# Patient Record
Sex: Female | Born: 1995 | Race: White | Hispanic: No | Marital: Married | Smoking: Never smoker
Health system: Southern US, Community
[De-identification: ages and names within clinical notes are randomized; demographics above are authoritative.]

## PROBLEM LIST (undated history)

## (undated) DIAGNOSIS — F419 Anxiety disorder, unspecified: Secondary | ICD-10-CM

## (undated) DIAGNOSIS — N8300 Follicular cyst of ovary, unspecified side: Secondary | ICD-10-CM

## (undated) DIAGNOSIS — F988 Other specified behavioral and emotional disorders with onset usually occurring in childhood and adolescence: Secondary | ICD-10-CM

## (undated) DIAGNOSIS — Z8489 Family history of other specified conditions: Secondary | ICD-10-CM

## (undated) DIAGNOSIS — R102 Pelvic and perineal pain: Secondary | ICD-10-CM

## (undated) HISTORY — PX: WISDOM TOOTH EXTRACTION: SHX21

---
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
## 2014-03-05 ENCOUNTER — Encounter: Payer: Self-pay | Admitting: Family Medicine

## 2014-03-05 ENCOUNTER — Ambulatory Visit (INDEPENDENT_AMBULATORY_CARE_PROVIDER_SITE_OTHER): Admitting: Family Medicine

## 2014-03-05 VITALS — BP 106/58 | HR 64 | Temp 98.0°F | Resp 12 | Ht 63.0 in | Wt 141.0 lb

## 2014-03-05 DIAGNOSIS — N3000 Acute cystitis without hematuria: Secondary | ICD-10-CM

## 2014-03-05 DIAGNOSIS — R300 Dysuria: Secondary | ICD-10-CM

## 2014-03-05 LAB — URINALYSIS, ROUTINE W REFLEX MICROSCOPIC
Bilirubin Urine: NEGATIVE
GLUCOSE, UA: NEGATIVE mg/dL
Ketones, ur: NEGATIVE mg/dL
NITRITE: POSITIVE — AB
PH: 6 (ref 5.0–8.0)
Protein, ur: 100 mg/dL — AB
SPECIFIC GRAVITY, URINE: 1.015 (ref 1.005–1.030)
Urobilinogen, UA: 0.2 mg/dL (ref 0.0–1.0)

## 2014-03-05 LAB — URINALYSIS, MICROSCOPIC ONLY
CASTS: NONE SEEN
CRYSTALS: NONE SEEN

## 2014-03-05 MED ORDER — FLUCONAZOLE 150 MG PO TABS: 150.0000 mg | ORAL_TABLET | Freq: Every day | ORAL | Status: DC

## 2014-03-05 MED ORDER — NITROFURANTOIN MONOHYD MACRO 100 MG PO CAPS: 100.0000 mg | ORAL_CAPSULE | Freq: Two times a day (BID) | ORAL | Status: DC

## 2014-03-05 NOTE — Patient Instructions (Signed)
Call if not improved Take macrobid first, then take the diflucan Plenty of water and cranberry juice

## 2014-03-05 NOTE — Progress Notes (Signed)
Patient ID: Anne Underwood, female   DOB: Dec 13, 1995, 18 y.o.   MRN: [id number]   Subjective:    Patient ID: Anne Underwood, female    DOB: Dec 13, 1995, 18 y.o.   MRN: [id number]  Patient presents for possible UTI  patient here with dysuria for the past week and a half. She has some abdominal cramping associated. She denies any blood in her urine. She did take some over-the-counter AZO with minimal improvement. She is sexually active with one partner she is using her birth control her last initial period was 2 weeks ago. She did have some mild discharge today. No fever    Review Of Systems:  GEN- denies fatigue, fever, weight loss,weakness, recent illness HEENT- denies eye drainage, change in vision, nasal discharge, CVS- denies chest pain, palpitations RESP- denies SOB, cough, wheeze ABD- denies N/V, change in stools, abd pain GU- + dysuria, denies hematuria, dribbling, incontinence MSK- denies joint pain, muscle aches, injury Neuro- denies headache, dizziness, syncope, seizure activity       Objective:    BP 106/58  Pulse 64  Temp(Src) 98 F (36.7 C) (Oral)  Resp 12  Ht 5\' 3"  (1.6 m)  Wt 141 lb (63.957 kg)  BMI 24.98 kg/m2 GEN- NAD, alert and oriented x3 ABD-NABS,soft,suprapubic tenderness, no cva tenderness Pulses- Radial 2+       Assessment & Plan:      Problem List Items Addressed This Visit   None    Visit Diagnoses   Acute cystitis without hematuria    -  Primary    Uncomplicated UTD, Macrobid x 7 days, diflucan at end of course,     Burning with urination        Relevant Orders       Urinalysis, Routine w reflex microscopic (Completed)       Note: This dictation was prepared with Dragon dictation along with smaller phrase technology. Any transcriptional errors that result from this process are unintentional.

## 2014-06-07 ENCOUNTER — Ambulatory Visit (INDEPENDENT_AMBULATORY_CARE_PROVIDER_SITE_OTHER): Admitting: Family Medicine

## 2014-06-07 ENCOUNTER — Encounter: Payer: Self-pay | Admitting: Family Medicine

## 2014-06-07 VITALS — BP 100/62 | HR 88 | Temp 98.7°F | Resp 14 | Ht 63.0 in | Wt 139.0 lb

## 2014-06-07 DIAGNOSIS — J069 Acute upper respiratory infection, unspecified: Secondary | ICD-10-CM

## 2014-06-07 DIAGNOSIS — F900 Attention-deficit hyperactivity disorder, predominantly inattentive type: Secondary | ICD-10-CM

## 2014-06-07 DIAGNOSIS — R5081 Fever presenting with conditions classified elsewhere: Secondary | ICD-10-CM

## 2014-06-07 DIAGNOSIS — F988 Other specified behavioral and emotional disorders with onset usually occurring in childhood and adolescence: Secondary | ICD-10-CM

## 2014-06-07 DIAGNOSIS — H6501 Acute serous otitis media, right ear: Secondary | ICD-10-CM

## 2014-06-07 DIAGNOSIS — F410 Panic disorder [episodic paroxysmal anxiety] without agoraphobia: Secondary | ICD-10-CM

## 2014-06-07 MED ORDER — AMOXICILLIN 500 MG PO CAPS: 500.0000 mg | ORAL_CAPSULE | Freq: Three times a day (TID) | ORAL | Status: DC

## 2014-06-07 MED ORDER — METHYLPHENIDATE HCL ER (OSM) 18 MG PO TBCR: 18.0000 mg | EXTENDED_RELEASE_TABLET | Freq: Every day | ORAL | Status: DC

## 2014-06-07 NOTE — Progress Notes (Signed)
Patient ID: Lucas MallowHalie M Hibler, female   DOB: [birth year], 18 y.o.   MRN: [id number]   Subjective:    Patient ID: Lucas MallowHalie M Lewter, female    DOB: [birth year], 18 y.o.   MRN: [id number]  Patient presents for Illness and Difficulty Concentrating  Cough with productions, sinus pressure and drainage, right ear pain for a little over a week, + fever. [facility] diagnosed viral this was a few days into illness but not improving, taking  Nyquil.  Difficulty concentrating on school for years but now in college and grades are starting to suffer. Mind jumps around and she can not focus. She was afraid to bring it up at previous visits, thought she do her college work without any help. Panic attacks are under good control with prozac. Has never been on meds, she discussed with family would like to start something for her concentration. She does notice inattention outside of school as well. Family has noted before while in middle and high school but did not want to pursue meds at that time    Review Of Systems:  GEN- + fatigue,+ fever, weight loss,weakness, recent illness HEENT- denies eye drainage, change in vision,+ nasal discharge, CVS- denies chest pain, palpitations RESP- denies SOB, +cough, wheeze ABD- denies N/V, change in stools, abd pain GU- denies dysuria, hematuria, dribbling, incontinence MSK- denies joint pain, muscle aches, injury Neuro- denies headache, dizziness, syncope, seizure activity       Objective:    BP 100/62  Pulse 88  Temp(Src) 98.7 F (37.1 C) (Oral)  Resp 14  Ht 5\' 3"  (1.6 m)  Wt 139 lb (63.05 kg)  BMI 24.63 kg/m2 GEN- NAD, alert and oriented x3 HEENT- PERRL, EOMI, non injected sclera, pink conjunctiva, MMM, oropharynx clear, injected right TM with decreased light reflex, clear fluid, canal clear, left TM clear  Neck- Supple, shotty ant nodes CVS- RRR, no murmur RESP-CTAB Psych- normal affect and mood Pulses- Radial 2+  NEG Flu      Assessment & Plan:       Problem List Items Addressed This Visit   None    Visit Diagnoses   Fever presenting with conditions classified elsewhere    -  Primary    Relevant Orders       Influenza a and b    Right acute serous otitis media, recurrence not specified        Treat with amox x 10 days    Relevant Medications       amoxicillin (AMOXIL) capsule    Acute URI        Mucinex DM    ADD (attention deficit disorder) without hyperactivity        Trial of Concerta 18mg , discussed proper use of meds, concerns about dependence and abuse       Note: This dictation was prepared with Dragon dictation along with smaller phrase technology. Any transcriptional errors that result from this process are unintentional.

## 2014-06-07 NOTE — Patient Instructions (Addendum)
Use Mucinex DM  Start antibiotics  Start concerta once a day  F/U 4 weeks for new meds

## 2014-06-07 NOTE — Assessment & Plan Note (Signed)
Well controlled on SSRI

## 2014-06-08 ENCOUNTER — Telehealth: Payer: Self-pay | Admitting: Family Medicine

## 2014-06-08 MED ORDER — GUAIFENESIN-CODEINE 100-10 MG/5ML PO SYRP: 5.0000 mL | ORAL_SOLUTION | Freq: Three times a day (TID) | ORAL | Status: DC | PRN

## 2014-06-08 NOTE — Telephone Encounter (Signed)
Send in robitussin with codiene q 6 hours prn

## 2014-06-08 NOTE — Telephone Encounter (Signed)
Patient was in yesterday, mom is calling to say that she cannot sleep because of the coughing could we please call her in some cough syrup or something to help her? Please call mom at [phone]  *she is going back to college today at one**

## 2014-06-08 NOTE — Telephone Encounter (Signed)
Robitussin with codeine called into pharmacy, pt mother aware.

## 2014-06-21 ENCOUNTER — Telehealth: Payer: Self-pay | Admitting: Family Medicine

## 2014-06-21 NOTE — Telephone Encounter (Signed)
This is a stimulant medication it can cause the jitterness, have her discontinue the medication to let symptoms subside, she can come by the office and pick up non stimulant Blase MessStraterra which she can try in 1 week

## 2014-06-21 NOTE — Telephone Encounter (Signed)
Patient has not been seen in 9 months.  I would like to see her to discuss.  Meanwhile hold the meds to improve her symptoms.

## 2014-06-21 NOTE — Telephone Encounter (Signed)
Samples obtained from MD with directions to begin 25mg  PO QD x2 weeks, then increase to 40mg  PO QD and F/U in office in 4 weeks.   Call placed to patient. LMTRC.

## 2014-06-21 NOTE — Telephone Encounter (Signed)
Patients mom is calling to let us know that Zannah would like a call back regarding her add medication because it is making her really jittery [phone]

## 2014-06-21 NOTE — Telephone Encounter (Signed)
Dr. Jeanice Limurham saw pt will forward to her

## 2014-06-23 NOTE — Telephone Encounter (Signed)
Call placed to patient and patient made aware.  

## 2014-07-02 ENCOUNTER — Encounter: Payer: Self-pay | Admitting: Family Medicine

## 2014-07-02 ENCOUNTER — Telehealth: Payer: Self-pay | Admitting: Family Medicine

## 2014-07-02 ENCOUNTER — Ambulatory Visit: Admitting: Family Medicine

## 2014-07-02 ENCOUNTER — Ambulatory Visit (INDEPENDENT_AMBULATORY_CARE_PROVIDER_SITE_OTHER): Admitting: Family Medicine

## 2014-07-02 VITALS — BP 118/58 | HR 78 | Temp 98.6°F | Resp 14 | Ht 62.0 in | Wt 138.0 lb

## 2014-07-02 DIAGNOSIS — F988 Other specified behavioral and emotional disorders with onset usually occurring in childhood and adolescence: Secondary | ICD-10-CM | POA: Insufficient documentation

## 2014-07-02 DIAGNOSIS — F909 Attention-deficit hyperactivity disorder, unspecified type: Secondary | ICD-10-CM

## 2014-07-02 MED ORDER — ATOMOXETINE HCL 25 MG PO CAPS: 25.0000 mg | ORAL_CAPSULE | Freq: Every day | ORAL | Status: DC

## 2014-07-02 NOTE — Assessment & Plan Note (Signed)
Trial of stratterra, start with 25mg  once a day for 2 weeks, increase to 40mg  once a day return in 4 weeks, she is very sensitive to medications therefore our titrate her slowly

## 2014-07-02 NOTE — Telephone Encounter (Signed)
Patient returned call and states that she now thinks that she will be able to make it to the office in a reasonable amount of time.   Advised to come to office and MD will work in.

## 2014-07-02 NOTE — Progress Notes (Signed)
Patient ID: Anne Underwood, female   DOB: [birth year], 18 y.o.   MRN: [id number]   Subjective:    Patient ID: Anne MallowHalie M Underwood, female    DOB: [birth year], 18 y.o.   MRN: [id number]  Patient presents for Medication Review patient here to follow medication she was started on Concerta secondary to adult attention deficit disorder. She had significant side effects with jitteriness and insomnia and was unable to tolerate this medication at 18 mg after one week. She is here to try a different medication. She has no new concerns today. She still having difficulty concentrating and her midterms she did fair on. She is getting tutoring at college as well    Review Of Systems:  GEN- denies fatigue, fever, weight loss,weakness, recent illness HEENT- denies eye drainage, change in vision, nasal discharge, CVS- denies chest pain, palpitations RESP- denies SOB, cough, wheeze ABD- denies N/V, change in stools, abd pain GU- denies dysuria, hematuria, dribbling, incontinence MSK- denies joint pain, muscle aches, injury Neuro- denies headache, dizziness, syncope, seizure activity       Objective:    BP 118/58 mmHg  Pulse 78  Temp(Src) 98.6 F (37 C) (Oral)  Resp 14  Ht 5\' 2"  (1.575 m)  Wt 138 lb (62.596 kg)  BMI 25.23 kg/m2  LMP 06/14/2014 (Approximate) GEN- NAD, alert and oriented x3 CVS- RRR, no murmur RESP-CTAB Psych- normal affect and mood        Assessment & Plan:      Problem List Items Addressed This Visit    None      Note: This dictation was prepared with Dragon dictation along with smaller phrase technology. Any transcriptional errors that result from this process are unintentional.

## 2014-07-02 NOTE — Patient Instructions (Signed)
Start staterra 25mg  once a day with breakfast for 2 weeks Then increase 40mg  once a day  F/U 4 weeks
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
## 2014-07-02 NOTE — Telephone Encounter (Signed)
noted 

## 2014-07-02 NOTE — Telephone Encounter (Signed)
MD made aware.

## 2014-08-11 ENCOUNTER — Telehealth: Payer: Self-pay | Admitting: *Deleted

## 2014-08-11 NOTE — Telephone Encounter (Signed)
Received call from patient.   Reports that she is having many episodes of extreme fatigue. Reports that episodes occur approximately 1 hour after taking Strattera. Reports that fatigue lasts all day.   States that she has not taken medication on 08/10/2014 or 08/11/2014 and has not noted fatigue.   MD please advise.

## 2014-08-12 NOTE — Telephone Encounter (Signed)
I believe Dr. Jeanice Limurham started her on straterra.  Will refer to her to review.

## 2014-08-13 NOTE — Telephone Encounter (Signed)
Provider recommendations to pt and follow up appt made

## 2014-08-13 NOTE — Telephone Encounter (Signed)
She should be in 40mg  of stratterra given samples This medicine rarely gives fatigue, but she can try to stop for 1 week and make an appt so I can adjust medications

## 2014-08-23 ENCOUNTER — Encounter: Payer: Self-pay | Admitting: Family Medicine

## 2014-08-23 ENCOUNTER — Ambulatory Visit (INDEPENDENT_AMBULATORY_CARE_PROVIDER_SITE_OTHER): Admitting: Family Medicine

## 2014-08-23 VITALS — BP 112/64 | HR 70 | Temp 98.3°F | Resp 14 | Ht 62.0 in | Wt 138.0 lb

## 2014-08-23 DIAGNOSIS — J069 Acute upper respiratory infection, unspecified: Secondary | ICD-10-CM

## 2014-08-23 DIAGNOSIS — F909 Attention-deficit hyperactivity disorder, unspecified type: Secondary | ICD-10-CM

## 2014-08-23 DIAGNOSIS — F988 Other specified behavioral and emotional disorders with onset usually occurring in childhood and adolescence: Secondary | ICD-10-CM

## 2014-08-23 MED ORDER — LISDEXAMFETAMINE DIMESYLATE 30 MG PO CAPS: 30.0000 mg | ORAL_CAPSULE | Freq: Every day | ORAL | Status: DC

## 2014-08-23 NOTE — Patient Instructions (Signed)
Try the vyvanse   Call if not better or send email Use mucinex DM Okay to continue the allergy pill F/U 8 weeks

## 2014-08-23 NOTE — Assessment & Plan Note (Signed)
Trial of vyvanse 30mg  once a day F/u by phone, hopefully she will be able to tolerate this better

## 2014-08-23 NOTE — Progress Notes (Signed)
Patient ID: Anne Underwood, female   DOB: 10-18-1995, 18 y.o.   MRN: [id number]   Subjective:    Patient ID: Anne Underwood, female    DOB: 10-18-1995, 18 y.o.   MRN: [id number]  Patient presents for Medication Review patient here to follow-up medications. I'm treating her for ADD mostly difficulties with concentration and inattentiveness. She was tried on Concerta however she felt very jittery and overstimulated with the medication therefore we discontinued this. We then try Strattera at a low-dose do not have any side effects. Did not help her concentration we then increases to 40 mg she took one tablet states that she felt very sick and that she was on a pass out therefore she discontinued the medication also try something different. Of note she has had some allergy symptoms and a mild cough with minimal production the past couple days no fever no shortness of breath no wheezing.    Review Of Systems:  GEN- denies fatigue, fever, weight loss,weakness, recent illness HEENT- denies eye drainage, change in vision, nasal discharge, CVS- denies chest pain, palpitations RESP- denies SOB, +cough, wheeze ABD- denies N/V, change in stools, abd pain GU- denies dysuria, hematuria, dribbling, incontinence MSK- denies joint pain, muscle aches, injury Neuro- denies headache, dizziness, syncope, seizure activity       Objective:    BP 112/64 mmHg  Pulse 70  Temp(Src) 98.3 F (36.8 C) (Oral)  Resp 14  Ht 5\' 2"  (1.575 m)  Wt 138 lb (62.596 kg)  BMI 25.23 kg/m2 GEN- NAD, alert and oriented x3 HEENT- PERRL, EOMI, non injected sclera, pink conjunctiva, MMM, oropharynx clear, TM clear bilat no effusion, no maxillary sinus tenderness, nares clear, + post nasal drip Neck- Supple, no LAD CVS- RRR, no murmur RESP-CTAB Psych normal affect and mood EXT- No edema Pulses- Radial 2+          Assessment & Plan:      Problem List Items Addressed This Visit      Unprioritized   ADD (attention  deficit disorder) - Primary      Note: This dictation was prepared with Dragon dictation along with smaller phrase technology. Any transcriptional errors that result from this process are unintentional.

## 2014-09-06 ENCOUNTER — Encounter: Payer: Self-pay | Admitting: Family Medicine

## 2014-09-06 ENCOUNTER — Ambulatory Visit (INDEPENDENT_AMBULATORY_CARE_PROVIDER_SITE_OTHER): Admitting: Family Medicine

## 2014-09-06 VITALS — BP 116/62 | HR 64 | Temp 98.5°F | Resp 14 | Ht 62.0 in | Wt 141.0 lb

## 2014-09-06 DIAGNOSIS — F909 Attention-deficit hyperactivity disorder, unspecified type: Secondary | ICD-10-CM

## 2014-09-06 DIAGNOSIS — F988 Other specified behavioral and emotional disorders with onset usually occurring in childhood and adolescence: Secondary | ICD-10-CM

## 2014-09-06 MED ORDER — LISDEXAMFETAMINE DIMESYLATE 30 MG PO CAPS: 30.0000 mg | ORAL_CAPSULE | Freq: Every day | ORAL | Status: DC

## 2014-09-06 NOTE — Assessment & Plan Note (Signed)
Doing well on vyvnase, no side effects, will keep at same dose, states concentration is good at this dose Scripts for next 3 months given, if needed she will schedule appt or email if dose change is required Will f/u in office 6 months for CPE Okay for mother to pick up scripts since she is away at school

## 2014-09-06 NOTE — Patient Instructions (Signed)
Continue vyvanse at current dose 3 months of scripts given F/U 6 months

## 2014-09-06 NOTE — Progress Notes (Signed)
Patient ID: Anne Underwood, female   DOB: [birth year], 19 y.o.   MRN: [id number]   Subjective:    Patient ID: Anne Underwood, female    DOB: [birth year], 19 y.o.   MRN: [id number]  Patient presents for Medication Review  Pt here to f/u Vyvnase, changed about 3 weeks ago, due to SE with Concerta. Strattera did not work. Vyvanse works well, no SE, no insomnia, no jitteryness, no change in appetite. Would like to stay at current dose, feels she can focus without any problems.     Review Of Systems:  GEN- denies fatigue, fever, weight loss,weakness, recent illness HEENT- denies eye drainage, change in vision, nasal discharge, CVS- denies chest pain, palpitations RESP- denies SOB, cough, wheeze ABD- denies N/V, change in stools, abd pain GU- denies dysuria, hematuria, dribbling, incontinence MSK- denies joint pain, muscle aches, injury Neuro- denies headache, dizziness, syncope, seizure activity       Objective:    BP 116/62 mmHg  Pulse 64  Temp(Src) 98.5 F (36.9 C) (Oral)  Resp 14  Ht 5\' 2"  (1.575 m)  Wt 141 lb (63.957 kg)  BMI 25.78 kg/m2  LMP 09/04/2014 (Approximate) GEN- NAD, alert and oriented x3 Psych- normal affect and mood        Assessment & Plan:      Problem List Items Addressed This Visit      Unprioritized   ADD (attention deficit disorder) - Primary    Doing well on vyvnase, no side effects, will keep at same dose, states concentration is good at this dose Scripts for next 3 months given, if needed she will schedule appt or email if dose change is required Will f/u in office 6 months for CPE Okay for mother to pick up scripts since she is away at school       Note: This dictation was prepared with Dragon dictation along with smaller phrase technology. Any transcriptional errors that result from this process are unintentional.

## 2014-09-22 ENCOUNTER — Telehealth: Payer: Self-pay | Admitting: *Deleted

## 2014-09-22 NOTE — Telephone Encounter (Signed)
MD please advise

## 2014-09-22 NOTE — Telephone Encounter (Signed)
Pt called stating she is taking tri lo sprintec Birth control pills and had stopped taking it d/t making her sick, was originally on ortho tri cyclen but pharmacist but on sprintec because it was a cheaper version, wants to know if you can prescrbe her th ortho tri cyclen instead.  [id number] - [place], [place] - [address] AT [address]

## 2014-09-23 MED ORDER — ORTHO TRI-CYCLEN LO 0.18/0.215/0.25 MG-25 MCG PO TABS: 1.0000 | ORAL_TABLET | Freq: Every day | ORAL | Status: DC

## 2014-09-23 NOTE — Telephone Encounter (Signed)
Prescription sent to pharmacy. .   Call placed to patient and patient made aware.  

## 2014-09-23 NOTE — Telephone Encounter (Signed)
Ok with switch back to ortho tricyclen

## 2014-10-13 ENCOUNTER — Encounter (HOSPITAL_COMMUNITY)
Admission: RE | Admit: 2014-10-13 | Discharge: 2014-10-13 | Disposition: A | Discharge status: Home / Self Care | Source: Ambulatory Visit | Attending: Obstetrics and Gynecology | Admitting: Obstetrics and Gynecology

## 2014-10-13 ENCOUNTER — Encounter (HOSPITAL_COMMUNITY): Payer: Self-pay

## 2014-10-13 DIAGNOSIS — F988 Other specified behavioral and emotional disorders with onset usually occurring in childhood and adolescence: Secondary | ICD-10-CM | POA: Diagnosis not present

## 2014-10-13 DIAGNOSIS — N8320 Unspecified ovarian cysts: Secondary | ICD-10-CM | POA: Diagnosis present

## 2014-10-13 DIAGNOSIS — F419 Anxiety disorder, unspecified: Secondary | ICD-10-CM | POA: Diagnosis not present

## 2014-10-13 DIAGNOSIS — N831 Corpus luteum cyst: Secondary | ICD-10-CM | POA: Diagnosis not present

## 2014-10-13 DIAGNOSIS — R102 Pelvic and perineal pain: Secondary | ICD-10-CM | POA: Diagnosis not present

## 2014-10-13 HISTORY — DX: Anxiety disorder, unspecified: F41.9

## 2014-10-13 HISTORY — DX: Family history of other specified conditions: Z84.89

## 2014-10-13 HISTORY — DX: Other specified behavioral and emotional disorders with onset usually occurring in childhood and adolescence: F98.8

## 2014-10-13 LAB — CBC
HCT: 37.4 % (ref 36.0–46.0)
Hemoglobin: 12.3 g/dL (ref 12.0–15.0)
MCH: 27.9 pg (ref 26.0–34.0)
MCHC: 32.9 g/dL (ref 30.0–36.0)
MCV: 84.8 fL (ref 78.0–100.0)
PLATELETS: 239 10*3/uL (ref 150–400)
RBC: 4.41 MIL/uL (ref 3.87–5.11)
RDW: 14.1 % (ref 11.5–15.5)
WBC: 7.4 10*3/uL (ref 4.0–10.5)

## 2014-10-13 NOTE — Patient Instructions (Signed)
Your procedure is scheduled on:  Friday, October 15, 2014  Enter through the Main [facility]trance of [facility] at:  6:00 a.m.  Pick up the phone at the desk and dial [phone].  Call this number if you have problems the morning of surgery: [phone].  Remember: Do NOT eat food:  AFTER MIDNIGHT THURSDAY Do NOT drink clear liquids after: AFTER MIDNIGHT THURSDAY Take these medicines the morning of surgery with a SIP OF WATER:  PROZAC, VYVANSE  Do NOT wear jewelry (body piercing), metal hair clips/bobby pins, make-up, or nail polish. Do NOT wear lotions, powders, or perfumes.  You may wear deoderant. Do NOT shave for 48 hours prior to surgery. Do NOT bring valuables to the hospital. Contacts, dentures, or bridgework may not be worn into surgery.  Have a responsible adult drive you home and stay with you for 24 hours after your procedure  NO CELL PHONES ARE ALLOWED IN [facility]

## 2014-10-14 ENCOUNTER — Encounter (HOSPITAL_COMMUNITY): Payer: Self-pay | Admitting: Obstetrics and Gynecology

## 2014-10-14 DIAGNOSIS — N8300 Follicular cyst of ovary, unspecified side: Secondary | ICD-10-CM

## 2014-10-14 DIAGNOSIS — R102 Pelvic and perineal pain: Secondary | ICD-10-CM

## 2014-10-14 HISTORY — DX: Follicular cyst of ovary, unspecified side: N83.00

## 2014-10-14 HISTORY — DX: Pelvic and perineal pain: R10.2

## 2014-10-14 NOTE — H&P (Signed)
Anne Underwood is an 19 y.o. female with pelvic pain, persistent Right ovarian cyst. Approximately 5 cm.  L ovary with simple cyst on last US.  Pt with expectant management since 9/15, cyst persisted thru 1/16.  D/W pt and mother r/b/a of surgery, with poss cystectomy, RSO.  Voice understanding, wish to proceed.   Pertinent Gynecological History: Menses: regular every month without intermenstrual spotting Contraception: OCP (estrogen/progesterone) Blood transfusions: none Sexually transmitted diseases: no past history Previous GYN Procedures: N/A  Last mammogram: N/A  Last pap: N/A  OB History: G0P0   Menstrual History: No LMP recorded.    Past Medical History  Diagnosis Date  . Family history of adverse reaction to anesthesia     MOTHER HAD PONV  . ADD (attention deficit disorder)   . Anxiety   . Pelvic pain in female 10/14/2014  . Ovarian cyst, follicular 10/14/2014    Past Surgical History  Procedure Laterality Date  . Wisdom tooth extraction      Family History  Problem Relation Age of Onset  . Healthy Mother   . Cancer Mother [id number]    Breast Cancer  . Healthy Father   . Cancer Father     Social History:  reports that she has never smoked. She has never used smokeless tobacco. She reports that she does not drink alcohol or use illicit drugs.  Allergies: No Known Allergies   Meds Prozac, OTC Lo  All: NKDA   Review of Systems  Constitutional: Negative.   HENT: Negative.   Eyes: Negative.   Respiratory: Negative.   Cardiovascular: Negative.   Gastrointestinal: Positive for abdominal pain.  Genitourinary: Negative.   Musculoskeletal: Negative.   Skin: Negative.   Neurological: Negative.   Psychiatric/Behavioral: Negative.     There were no vitals taken for this visit. Physical Exam  Constitutional: She is oriented to person, place, and time. She appears well-developed and well-nourished.  HENT:  Head: Normocephalic and atraumatic.  Cardiovascular: Normal  rate and regular rhythm.   Respiratory: Effort normal and breath sounds normal. No respiratory distress. She has no wheezes.  GI: Soft. Bowel sounds are normal. She exhibits no distension. There is no tenderness.  Musculoskeletal: Normal range of motion.  Neurological: She is alert and oriented to person, place, and time.  Skin: Skin is warm and dry.  Psychiatric: She has a normal mood and affect. Her behavior is normal.    No results found for this or any previous visit (from the past 24 hour(s)).  No results found.   US R ovarian cyst, apx 5cm, am L simple cyst.  Nl uterus  Assessment/Plan: 19yo with persistent R ovarian cyst and pelvic pain Operative l/s - cystectomy - d/w pt r/b/a of procedure, wishes to proceed  Bovard-Stuckert, Ashana Tullo 10/14/2014, 9:47 PM

## 2014-10-15 ENCOUNTER — Ambulatory Visit (HOSPITAL_COMMUNITY): Admitting: Anesthesiology

## 2014-10-15 ENCOUNTER — Ambulatory Visit (HOSPITAL_COMMUNITY)
Admission: RE | Admit: 2014-10-15 | Discharge: 2014-10-15 | Disposition: A | Discharge status: Home / Self Care | Source: Ambulatory Visit | Attending: Obstetrics and Gynecology | Admitting: Obstetrics and Gynecology

## 2014-10-15 ENCOUNTER — Encounter (HOSPITAL_COMMUNITY)
Admission: RE | Disposition: A | Discharge status: Home / Self Care | Payer: Self-pay | Source: Ambulatory Visit | Attending: Obstetrics and Gynecology

## 2014-10-15 ENCOUNTER — Encounter (HOSPITAL_COMMUNITY): Payer: Self-pay

## 2014-10-15 DIAGNOSIS — N8300 Follicular cyst of ovary, unspecified side: Secondary | ICD-10-CM | POA: Diagnosis present

## 2014-10-15 DIAGNOSIS — N831 Corpus luteum cyst: Secondary | ICD-10-CM | POA: Insufficient documentation

## 2014-10-15 DIAGNOSIS — F988 Other specified behavioral and emotional disorders with onset usually occurring in childhood and adolescence: Secondary | ICD-10-CM | POA: Insufficient documentation

## 2014-10-15 DIAGNOSIS — F419 Anxiety disorder, unspecified: Secondary | ICD-10-CM | POA: Insufficient documentation

## 2014-10-15 DIAGNOSIS — R102 Pelvic and perineal pain: Secondary | ICD-10-CM | POA: Insufficient documentation

## 2014-10-15 HISTORY — DX: Follicular cyst of ovary, unspecified side: N83.00

## 2014-10-15 HISTORY — PX: OVARIAN CYST REMOVAL: SHX89

## 2014-10-15 HISTORY — PX: LAPAROSCOPY: SHX197

## 2014-10-15 HISTORY — DX: Pelvic and perineal pain: R10.2

## 2014-10-15 LAB — PREGNANCY, URINE: Preg Test, Ur: NEGATIVE

## 2014-10-15 SURGERY — EXCISION, CYST, OVARY
Anesthesia: General | Laterality: Right

## 2014-10-15 MED ORDER — KETOROLAC TROMETHAMINE 30 MG/ML IJ SOLN
INTRAMUSCULAR | Status: AC
  Filled 2014-10-15: qty 1

## 2014-10-15 MED ORDER — IBUPROFEN 800 MG PO TABS: 800.0000 mg | ORAL_TABLET | Freq: Three times a day (TID) | ORAL | Status: DC | PRN

## 2014-10-15 MED ORDER — ROCURONIUM BROMIDE 100 MG/10ML IV SOLN
INTRAVENOUS | Status: DC | PRN
  Administered 2014-10-15: 35 mg via INTRAVENOUS

## 2014-10-15 MED ORDER — MIDAZOLAM HCL 2 MG/2ML IJ SOLN
INTRAMUSCULAR | Status: DC | PRN
  Administered 2014-10-15: 2 mg via INTRAVENOUS

## 2014-10-15 MED ORDER — ONDANSETRON HCL 4 MG/2ML IJ SOLN
INTRAMUSCULAR | Status: DC | PRN
  Administered 2014-10-15: 4 mg via INTRAVENOUS

## 2014-10-15 MED ORDER — ONDANSETRON HCL 4 MG/2ML IJ SOLN
INTRAMUSCULAR | Status: AC
  Filled 2014-10-15: qty 2

## 2014-10-15 MED ORDER — FENTANYL CITRATE 0.05 MG/ML IJ SOLN
INTRAMUSCULAR | Status: DC | PRN
  Administered 2014-10-15 (×5): 50 ug via INTRAVENOUS

## 2014-10-15 MED ORDER — DEXAMETHASONE SODIUM PHOSPHATE 4 MG/ML IJ SOLN
INTRAMUSCULAR | Status: AC
  Filled 2014-10-15: qty 1

## 2014-10-15 MED ORDER — FENTANYL CITRATE 0.05 MG/ML IJ SOLN
25.0000 ug | INTRAMUSCULAR | Status: DC | PRN
  Administered 2014-10-15: 50 ug via INTRAVENOUS

## 2014-10-15 MED ORDER — LIDOCAINE HCL (CARDIAC) 20 MG/ML IV SOLN
INTRAVENOUS | Status: DC | PRN
  Administered 2014-10-15: 70 mg via INTRAVENOUS
  Administered 2014-10-15: 30 mg via INTRAVENOUS

## 2014-10-15 MED ORDER — MIDAZOLAM HCL 2 MG/2ML IJ SOLN
INTRAMUSCULAR | Status: AC
  Filled 2014-10-15: qty 2

## 2014-10-15 MED ORDER — SCOPOLAMINE 1 MG/3DAYS TD PT72
1.0000 | MEDICATED_PATCH | Freq: Once | TRANSDERMAL | Status: DC
  Administered 2014-10-15: 1.5 mg via TRANSDERMAL

## 2014-10-15 MED ORDER — MEPERIDINE HCL 25 MG/ML IJ SOLN: 6.2500 mg | INTRAMUSCULAR | Status: DC | PRN

## 2014-10-15 MED ORDER — SODIUM CHLORIDE 0.9 % IJ SOLN
INTRAMUSCULAR | Status: DC | PRN
  Administered 2014-10-15: 10 mL

## 2014-10-15 MED ORDER — NEOSTIGMINE METHYLSULFATE 10 MG/10ML IV SOLN
INTRAVENOUS | Status: DC | PRN
  Administered 2014-10-15: 4 mg via INTRAVENOUS

## 2014-10-15 MED ORDER — OXYCODONE-ACETAMINOPHEN 5-325 MG PO TABS: 1.0000 | ORAL_TABLET | Freq: Four times a day (QID) | ORAL | Status: DC | PRN

## 2014-10-15 MED ORDER — SODIUM CHLORIDE 0.9 % IJ SOLN
INTRAMUSCULAR | Status: AC
Start: 2014-10-15 — End: 2014-10-15
  Filled 2014-10-15: qty 10

## 2014-10-15 MED ORDER — GLYCOPYRROLATE 0.2 MG/ML IJ SOLN
INTRAMUSCULAR | Status: AC
  Filled 2014-10-15: qty 3

## 2014-10-15 MED ORDER — GLYCOPYRROLATE 0.2 MG/ML IJ SOLN
INTRAMUSCULAR | Status: DC | PRN
Start: 2014-10-15 — End: 2014-10-15
  Administered 2014-10-15: 0.6 mg via INTRAVENOUS

## 2014-10-15 MED ORDER — KETOROLAC TROMETHAMINE 30 MG/ML IJ SOLN
INTRAMUSCULAR | Status: DC | PRN
  Administered 2014-10-15: 30 mg via INTRAVENOUS

## 2014-10-15 MED ORDER — METOCLOPRAMIDE HCL 5 MG/ML IJ SOLN: 10.0000 mg | Freq: Once | INTRAMUSCULAR | Status: DC | PRN

## 2014-10-15 MED ORDER — LACTATED RINGERS IV SOLN: INTRAVENOUS | Status: DC

## 2014-10-15 MED ORDER — OXYCODONE-ACETAMINOPHEN 5-325 MG PO TABS
ORAL_TABLET | ORAL | Status: AC
  Administered 2014-10-15: 1 via ORAL
  Filled 2014-10-15: qty 1

## 2014-10-15 MED ORDER — BUPIVACAINE HCL (PF) 0.25 % IJ SOLN
INTRAMUSCULAR | Status: DC | PRN
  Administered 2014-10-15: 14 mL

## 2014-10-15 MED ORDER — SCOPOLAMINE 1 MG/3DAYS TD PT72
MEDICATED_PATCH | TRANSDERMAL | Status: AC
  Administered 2014-10-15: 1.5 mg via TRANSDERMAL
  Filled 2014-10-15: qty 1

## 2014-10-15 MED ORDER — ROCURONIUM BROMIDE 100 MG/10ML IV SOLN
INTRAVENOUS | Status: AC
  Filled 2014-10-15: qty 1

## 2014-10-15 MED ORDER — LACTATED RINGERS IV SOLN
INTRAVENOUS | Status: DC
  Administered 2014-10-15: 06:00:00 via INTRAVENOUS
  Administered 2014-10-15: 1000 mL via INTRAVENOUS
  Administered 2014-10-15: 07:00:00 via INTRAVENOUS

## 2014-10-15 MED ORDER — DEXAMETHASONE SODIUM PHOSPHATE 10 MG/ML IJ SOLN
INTRAMUSCULAR | Status: DC | PRN
  Administered 2014-10-15: 4 mg via INTRAVENOUS

## 2014-10-15 MED ORDER — PROPOFOL 10 MG/ML IV BOLUS
INTRAVENOUS | Status: DC | PRN
  Administered 2014-10-15: 180 mg via INTRAVENOUS

## 2014-10-15 MED ORDER — OXYCODONE HCL 5 MG/5ML PO SOLN: 5.0000 mg | Freq: Once | ORAL | Status: DC | PRN

## 2014-10-15 MED ORDER — NEOSTIGMINE METHYLSULFATE 10 MG/10ML IV SOLN
INTRAVENOUS | Status: AC
  Filled 2014-10-15: qty 1

## 2014-10-15 MED ORDER — PROPOFOL 10 MG/ML IV BOLUS
INTRAVENOUS | Status: AC
  Filled 2014-10-15: qty 20

## 2014-10-15 MED ORDER — FENTANYL CITRATE 0.05 MG/ML IJ SOLN
INTRAMUSCULAR | Status: AC
  Filled 2014-10-15: qty 5

## 2014-10-15 MED ORDER — LACTATED RINGERS IR SOLN
Status: DC | PRN
  Administered 2014-10-15: 3000 mL

## 2014-10-15 MED ORDER — FENTANYL CITRATE 0.05 MG/ML IJ SOLN
INTRAMUSCULAR | Status: AC
  Administered 2014-10-15: 50 ug via INTRAVENOUS
  Filled 2014-10-15: qty 2

## 2014-10-15 MED ORDER — BUPIVACAINE HCL (PF) 0.25 % IJ SOLN
INTRAMUSCULAR | Status: AC
  Filled 2014-10-15: qty 30

## 2014-10-15 MED ORDER — OXYCODONE HCL 5 MG PO TABS: 5.0000 mg | ORAL_TABLET | Freq: Once | ORAL | Status: DC | PRN

## 2014-10-15 SURGICAL SUPPLY — 24 items
CABLE HIGH FREQUENCY MONO STRZ (ELECTRODE) IMPLANT
CATH ROBINSON RED A/P 16FR (CATHETERS) ×3 IMPLANT
CHLORAPREP W/TINT 26ML (MISCELLANEOUS) ×3 IMPLANT
CLOTH BEACON ORANGE TIMEOUT ST (SAFETY) ×3 IMPLANT
DRSG COVADERM PLUS 2X2 (GAUZE/BANDAGES/DRESSINGS) ×6 IMPLANT
DRSG OPSITE POSTOP 3X4 (GAUZE/BANDAGES/DRESSINGS) ×3 IMPLANT
GLOVE BIO SURGEON STRL SZ 6.5 (GLOVE) ×3 IMPLANT
GOWN STRL REUS W/TWL LRG LVL3 (GOWN DISPOSABLE) ×6 IMPLANT
LIQUID BAND (GAUZE/BANDAGES/DRESSINGS) ×3 IMPLANT
NEEDLE INSUFFLATION 120MM (ENDOMECHANICALS) ×3 IMPLANT
NS IRRIG 1000ML POUR BTL (IV SOLUTION) ×3 IMPLANT
PACK LAPAROSCOPY BASIN (CUSTOM PROCEDURE TRAY) ×3 IMPLANT
PAD POSITIONER PINK NONSTERILE (MISCELLANEOUS) ×3 IMPLANT
POUCH SPECIMEN RETRIEVAL 10MM (ENDOMECHANICALS) ×3 IMPLANT
PROTECTOR NERVE ULNAR (MISCELLANEOUS) ×3 IMPLANT
SET IRRIG TUBING LAPAROSCOPIC (IRRIGATION / IRRIGATOR) ×3 IMPLANT
SHEARS HARMONIC ACE PLUS 36CM (ENDOMECHANICALS) ×3 IMPLANT
SUT VICRYL 0 UR6 27IN ABS (SUTURE) ×3 IMPLANT
SUT VICRYL 4-0 PS2 18IN ABS (SUTURE) ×3 IMPLANT
TOWEL OR 17X24 6PK STRL BLUE (TOWEL DISPOSABLE) ×6 IMPLANT
TROCAR XCEL NON-BLD 11X100MML (ENDOMECHANICALS) ×3 IMPLANT
TROCAR XCEL NON-BLD 5MMX100MML (ENDOMECHANICALS) ×6 IMPLANT
WARMER LAPAROSCOPE (MISCELLANEOUS) ×3 IMPLANT
WATER STERILE IRR 1000ML POUR (IV SOLUTION) ×3 IMPLANT

## 2014-10-15 NOTE — Brief Op Note (Signed)
10/15/2014  8:52 AM  PATIENT:  Lucas MallowHalie M Kirley  [age] y.o. female  PRE-OPERATIVE DIAGNOSIS:  Ovarian Cyst, Pelvic Pain  POST-OPERATIVE DIAGNOSIS:  Ovarian Cyst, Pelvic Pain  PROCEDURE:  Procedure(s) with comments: OVARIAN CYSTECTOMY (Right) - Dr only needs 1hr OR time LAPAROSCOPY OPERATIVE (N/A)  SURGEON:  Surgeon(s) and Role:    * Sherian ReinJody Bovard-Stuckert, MD - Primary  ANESTHESIA:   local and general  EBL:  Total I/O In: 100 [I.V.:100] Out: 150 [Urine:150]  BLOOD ADMINISTERED:none  DRAINS: none   LOCAL MEDICATIONS USED:  MARCAINE     SPECIMEN:  Source of Specimen:  ovarian cyst wall  DISPOSITION OF SPECIMEN:  PATHOLOGY  COUNTS:  YES  TOURNIQUET:  * No tourniquets in log *  DICTATION: .Other Dictation: Dictation Number [id number]  PLAN OF CARE: Discharge to home after PACU  PATIENT DISPOSITION:  PACU - hemodynamically stable.   Delay start of Pharmacological VTE agent (>24hrs) due to surgical blood loss or risk of bleeding: not applicable

## 2014-10-15 NOTE — Anesthesia Procedure Notes (Signed)
Procedure Name: Intubation Date/Time: 10/15/2014 7:28 AM Performed by: Suella GroveMOORE, Dejana Pugsley C Pre-anesthesia Checklist: Timeout performed, Patient identified, Emergency Drugs available, Suction available and Patient being monitored Patient Re-evaluated:Patient Re-evaluated prior to inductionOxygen Delivery Method: Circle system utilized and Simple face mask Preoxygenation: Pre-oxygenation with 100% oxygen Intubation Type: IV induction Ventilation: Mask ventilation without difficulty Laryngoscope Size: Mac and 3 Grade View: Grade II Tube type: Oral Tube size: 7.0 mm Number of attempts: 1 Placement Confirmation: ETT inserted through vocal cords under direct vision,  breath sounds checked- equal and bilateral and positive ETCO2 Secured at: 21 (com) cm Tube secured with: Tape Dental Injury: Teeth and Oropharynx as per pre-operative assessment

## 2014-10-15 NOTE — Anesthesia Postprocedure Evaluation (Signed)
  Anesthesia Post-op Note  Patient: Anne Underwood  Procedure(s) Performed: Procedure(s) with comments: OVARIAN CYSTECTOMY (Right) - Dr only needs 1hr OR time LAPAROSCOPY OPERATIVE (N/A)  Patient Location: PACU  Anesthesia Type:General  Level of Consciousness: awake, alert  and oriented  Airway and Oxygen Therapy: Patient Spontanous Breathing  Post-op Pain: mild  Post-op Assessment: Post-op Vital signs reviewed, Patient's Cardiovascular Status Stable, Respiratory Function Stable, Patent Airway, No signs of Nausea or vomiting and Pain level controlled  Post-op Vital Signs: Reviewed and stable  Last Vitals:  Filed Vitals:   10/15/14 0834  BP:   Pulse:   Temp: 36.7 C  Resp:     Complications: No apparent anesthesia complications

## 2014-10-15 NOTE — Discharge Instructions (Signed)
DISCHARGE INSTRUCTIONS: Laparoscopy  No Ibuprofen containing products (ie Advil, Aleve, Motrin, etc.) until after 2:00 pm today.  The following instructions have been prepared to help you care for yourself upon your return home today.  Wound care:  Do not get the incision wet for the first 24 hours. The incision should be kept clean and dry.  The dressing on the right side of your abdomen may be removed two days after surgery.  Should the incision become sore, red, and swollen after the first week, check with your doctor.  Personal hygiene:  Shower the day after your procedure.  Activity and limitations:  Do NOT drive or operate any equipment today.  Do NOT lift anything more than 15 pounds for 2-3 weeks after surgery.  Do NOT rest in bed all day.  Walking is encouraged. Walk each day, starting slowly with 5-minute walks 3 or 4 times a day. Slowly increase the length of your walks.  Walk up and down stairs slowly.  Do NOT do strenuous activities, such as golfing, playing tennis, bowling, running, biking, weight lifting, gardening, mowing, or vacuuming for 2-4 weeks. Ask your doctor when it is okay to start.  Diet: Eat a light meal as desired this evening. You may resume your usual diet tomorrow.  Return to work: This is dependent on the type of work you do. For the most part you can return to a desk job within a week of surgery. If you are more active at work, please discuss this with your doctor.  What to expect after your surgery: You may have a slight burning sensation when you urinate on the first day. You may have a very small amount of blood in the urine. Expect to have a small amount of vaginal discharge/light bleeding for 1-2 weeks. It is not unusual to have abdominal soreness and bruising for up to 2 weeks. You may be tired and need more rest for about 1 week. You may experience shoulder pain for 24-72 hours. Lying flat in bed may relieve it.  Call your doctor for any  of the following:  Develop a fever of 100.4 or greater  Inability to urinate 6 hours after discharge from hospital  Severe pain not relieved by pain medications  Persistent of heavy bleeding at incision site  Redness or swelling around incision site after a week  Increasing nausea or vomiting  Patient Signature________________________________________ Nurse Signature_________________________________________  PACU phone #:  [phone]

## 2014-10-15 NOTE — Transfer of Care (Signed)
Immediate Anesthesia Transfer of Care Note  Patient: Anne Underwood  Procedure(s) Performed: Procedure(s) with comments: OVARIAN CYSTECTOMY (Right) - Dr only needs 1hr OR time LAPAROSCOPY OPERATIVE (N/A)  Patient Location: PACU  Anesthesia Type:General  Level of Consciousness: awake, alert , oriented and patient cooperative  Airway & Oxygen Therapy: Patient Spontanous Breathing and Patient connected to nasal cannula oxygen  Post-op Assessment: Report given to RN and Post -op Vital signs reviewed and stable  Post vital signs: Reviewed and stable  Last Vitals:  Filed Vitals:   10/15/14 0606  BP: 114/65  Pulse: 72  Temp: 36.4 C  Resp: 16    Complications: No apparent anesthesia complications

## 2014-10-15 NOTE — Anesthesia Preprocedure Evaluation (Addendum)
Anesthesia Evaluation  Patient identified by MRN, date of birth, ID band Patient awake    Reviewed: Allergy & Precautions, NPO status , Patient's Chart, lab work & pertinent test results  History of Anesthesia Complications (+) history of anesthetic complications (mother has PONV)  Airway Mallampati: II  TM Distance: >3 FB Neck ROM: Full    Dental no notable dental hx. (+) Dental Advisory Given   Pulmonary neg pulmonary ROS,  breath sounds clear to auscultation  Pulmonary exam normal       Cardiovascular negative cardio ROS  Rhythm:Regular Rate:Normal     Neuro/Psych  Headaches, PSYCHIATRIC DISORDERS (ADD) Anxiety Depression ADHD   GI/Hepatic negative GI ROS, Neg liver ROS,   Endo/Other  negative endocrine ROS  Renal/GU negative Renal ROS  negative genitourinary   Musculoskeletal negative musculoskeletal ROS (+)   Abdominal   Peds negative pediatric ROS (+)  Hematology negative hematology ROS (+)   Anesthesia Other Findings   Reproductive/Obstetrics negative OB ROS Ovarian Cyst  Pelvic pain                            Anesthesia Physical Anesthesia Plan  ASA: II  Anesthesia Plan: General   Post-op Pain Management:    Induction: Intravenous  Airway Management Planned: Oral ETT  Additional Equipment:   Intra-op Plan:   Post-operative Plan: Extubation in OR  Informed Consent: I have reviewed the patients History and Physical, chart, labs and discussed the procedure including the risks, benefits and alternatives for the proposed anesthesia with the patient or authorized representative who has indicated his/her understanding and acceptance.   Dental advisory given  Plan Discussed with: CRNA, Anesthesiologist and Surgeon  Anesthesia Plan Comments:        Anesthesia Quick Evaluation

## 2014-10-15 NOTE — Interval H&P Note (Signed)
History and Physical Interval Note:  10/15/2014 7:14 AM  Anne Underwood  has presented today for surgery, with the diagnosis of Ovarian Cyst, Pelvic Pain  The various methods of treatment have been discussed with the patient and family. After consideration of risks, benefits and other options for treatment, the patient has consented to  Procedure(s) with comments: OVARIAN CYSTECTOMY (Right) - Dr only needs 1hr OR time LAPAROSCOPY OPERATIVE (N/A) as a surgical intervention .  The patient's history has been reviewed, patient examined, no change in status, stable for surgery.  I have reviewed the patient's chart and labs.  Questions were answered to the patient's satisfaction.     Bovard-Stuckert, Jordyne Poehlman

## 2014-10-16 NOTE — Op Note (Signed)
[name], Anne Underwood                 ACCOUNT NO.:  [id number]  MEDICAL RECORD NO.:  [id number]  LOCATION:  [facility]                          [facility]ACILITY:  [facility]  PHYSICIAN:  Sherron MondayJody Bovard, MD        DATE OF BIRTH:  1996/08/16  DATE OF PROCEDURE:  10/15/2014 DATE OF DISCHARGE:  10/15/2014                              OPERATIVE REPORT   PREOPERATIVE DIAGNOSES:  Persistent right ovarian cyst, pelvic pain.  POSTOPERATIVE DIAGNOSES:  Persistent right ovarian cyst, pelvic pain.  PROCEDURES:  Operative laparoscopy, right ovarian cystectomy.  SURGEON:  Sherron MondayJody Bovard, MD  ANESTHESIA:  Local and general anesthesia.  IV FLUIDS:  100 mL.  URINE OUTPUT:  150 mL.  ESTIMATED BLOOD LOSS:  Minimal.  COMPLICATIONS:  None.  PATHOLOGY:  Ovarian cyst wall.  DESCRIPTION OF PROCEDURE:  After informed consent was reviewed with the patient and her family, she was transported to the OR, placed on the table in supine position.  General anesthesia was induced and found to be adequate.  She was then placed in the Yellofin stirrups.  Prepped and draped in the normal sterile fashion.  Her bladder was sterilely draped. An open-sided speculum was used to easily visualize her cervix and the uterine manipulator was placed.  Gloves and gown were changed. Attention was turned to the abdominal portion of the case and approximately 5 mm infraumbilical incision was made.  Using the Veress needle, peritoneum was entered after verification with the hanging drop test.  The abdomen was insufflated and with direct visualization, the 5 mm port was placed at the umbilicus.  The camera was used to perform a brief pelvic survey, revealing normal uterus, normal left tube and ovary, normal right tube, right ovary with a cyst, approximately 5 cm in size, normal appendix, normal liver edge, normal gallbladder.  Attention was then turned to placing the accessory ports, 5 mm port was placed on the right under direct visualization after  Marcaine had been infused in the skin.  On the left, a 10 mm port was placed, so the EndoCatch bag could be used.  The cyst was drained with the aid of a Harmonic scalpel. Simple straw colored fluid was expressed from the cul-de-sac and evacuated using the suction irrigator.  The cyst was then teased from the ovary using Harmonic scalpel, removed through the EndoCatch bag and this was noted to be hemostatic.  The ports were removed under direct visualization.  Abdomen was emptied of the gas.  The left port and the port were closed with deep stitches of 3-0 Vicryl and Dermabond on the skin.  The larger port site with a deep stitch of 0 Vicryl as well as the skin was reapproximated with 3-0 Vicryl and Dermabond was also applied.  The patient tolerated the procedure well.  Sponge, lap, and needle counts were correct x2.     Sherron MondayJody Bovard, MD     JB/MEDQ  D:  10/15/2014  T:  10/16/2014  Job:  [id number]

## 2014-10-18 ENCOUNTER — Encounter (HOSPITAL_COMMUNITY): Payer: Self-pay | Admitting: Obstetrics and Gynecology

## 2014-10-18 ENCOUNTER — Telehealth: Payer: Self-pay | Admitting: Family Medicine

## 2014-10-18 NOTE — Telephone Encounter (Signed)
Which med?

## 2014-10-18 NOTE — Telephone Encounter (Signed)
[phone]  Patient is calling to say that the generic bc is making her sick

## 2014-10-19 ENCOUNTER — Telehealth: Payer: Self-pay | Admitting: *Deleted

## 2014-10-19 NOTE — Telephone Encounter (Signed)
Pt calling to see if can change ortho tri cyclen bc to something else, states that her insurance will not cover it anymore also wanted you to be aware she can not take ortho lo sprintec bc d/t is makes her sick.Please advise!  [id number] - [place], [place] - [address] AT [address]  Pt call back number [phone]

## 2014-10-21 ENCOUNTER — Telehealth: Payer: Self-pay | Admitting: *Deleted

## 2014-10-21 NOTE — Telephone Encounter (Signed)
Received call from patient mother Selena BattenKim.   Reports that patient requires BC pill that is on prescription formulary or cheaper than what she is taking.   MD please advise.

## 2014-10-21 NOTE — Telephone Encounter (Signed)
Can she get a list of what her insurance will cover so that we can avoid multiple guesses.  If not, try loestrin 24 poqday

## 2014-10-21 NOTE — Telephone Encounter (Signed)
Contacted pt and she is going to try to contact her insurance and see which ones they can cover and give me a call back. Pending call back from pt

## 2014-10-22 ENCOUNTER — Telehealth: Payer: Self-pay | Admitting: Family Medicine

## 2014-10-22 MED ORDER — NORGESTIM-ETH ESTRAD TRIPHASIC 0.18/0.215/0.25 MG-35 MCG PO TABS: 1.0000 | ORAL_TABLET | Freq: Every day | ORAL | Status: DC

## 2014-10-22 NOTE — Telephone Encounter (Signed)
Change to tri-sprintec 1 po daily, okay to send in 1 year worth of refills

## 2014-10-22 NOTE — Telephone Encounter (Signed)
Call placed to patient mother, Anne BattenKim. LMTRC.

## 2014-10-22 NOTE — Telephone Encounter (Signed)
Prescription sent to pharmacy.

## 2014-10-22 NOTE — Telephone Encounter (Signed)
[facility] [address] [place]  [phone] is pharmacy phone number

## 2014-10-22 NOTE — Telephone Encounter (Signed)
Patient mother Selena BattenKim returned call and was made aware. Advised that this medication is on the [facility] list for $9.00 per month.   Requested to contact office with the name of the pharmacy that she wants prescription sent to.

## 2014-10-22 NOTE — Telephone Encounter (Signed)
Patients mom kim calling with name of pharmacy and phone number

## 2014-10-26 NOTE — Telephone Encounter (Signed)
See phone message from 10/21/14 through 10/22/2014

## 2014-11-04 ENCOUNTER — Ambulatory Visit: Admitting: Physician Assistant

## 2014-11-30 ENCOUNTER — Telehealth: Payer: Self-pay | Admitting: Family Medicine

## 2014-11-30 NOTE — Telephone Encounter (Signed)
Returned call to patient.   Reports that she remains stable with her anxiety and medication. States that her school does have animal therapy available. Advised that if note required form MD that we could send note to school.

## 2014-11-30 NOTE — Telephone Encounter (Signed)
Patient calling to talk with you regarding her anxiety  [phone]

## 2014-12-09 ENCOUNTER — Telehealth: Payer: Self-pay | Admitting: Family Medicine

## 2014-12-09 MED ORDER — LISDEXAMFETAMINE DIMESYLATE 30 MG PO CAPS: 30.0000 mg | ORAL_CAPSULE | Freq: Every day | ORAL | Status: DC

## 2014-12-09 NOTE — Telephone Encounter (Signed)
[phone]  Pt mother has called pt is needing a refill on ADHD medication mother wasn't sure of name but I think it is the lisdexamfetamine (VYVANSE) 30 MG capsule

## 2014-12-09 NOTE — Telephone Encounter (Signed)
Ok for 3 months since patient is away at college?

## 2014-12-09 NOTE — Telephone Encounter (Signed)
Prescription printed and patient made aware to come to office to pick up.  

## 2014-12-09 NOTE — Telephone Encounter (Signed)
ok 

## 2014-12-09 NOTE — Telephone Encounter (Signed)
Ok to refill??  Last office visit/ refill 09/06/2014, #3 month prescriptions printed.

## 2015-02-08 ENCOUNTER — Ambulatory Visit (INDEPENDENT_AMBULATORY_CARE_PROVIDER_SITE_OTHER): Admitting: *Deleted

## 2015-02-08 DIAGNOSIS — Z111 Encounter for screening for respiratory tuberculosis: Secondary | ICD-10-CM

## 2015-02-09 ENCOUNTER — Encounter (HOSPITAL_COMMUNITY): Payer: Self-pay

## 2015-02-09 ENCOUNTER — Emergency Department (HOSPITAL_COMMUNITY)
Admission: EM | Admit: 2015-02-09 | Discharge: 2015-02-09 | Disposition: A | Discharge status: Home / Self Care | Attending: Emergency Medicine | Admitting: Emergency Medicine

## 2015-02-09 ENCOUNTER — Emergency Department (HOSPITAL_COMMUNITY)

## 2015-02-09 DIAGNOSIS — Z792 Long term (current) use of antibiotics: Secondary | ICD-10-CM | POA: Insufficient documentation

## 2015-02-09 DIAGNOSIS — Z79899 Other long term (current) drug therapy: Secondary | ICD-10-CM | POA: Diagnosis not present

## 2015-02-09 DIAGNOSIS — R509 Fever, unspecified: Secondary | ICD-10-CM | POA: Insufficient documentation

## 2015-02-09 DIAGNOSIS — Z3202 Encounter for pregnancy test, result negative: Secondary | ICD-10-CM | POA: Diagnosis not present

## 2015-02-09 DIAGNOSIS — F909 Attention-deficit hyperactivity disorder, unspecified type: Secondary | ICD-10-CM | POA: Insufficient documentation

## 2015-02-09 DIAGNOSIS — R52 Pain, unspecified: Secondary | ICD-10-CM | POA: Insufficient documentation

## 2015-02-09 DIAGNOSIS — Z8742 Personal history of other diseases of the female genital tract: Secondary | ICD-10-CM | POA: Diagnosis not present

## 2015-02-09 LAB — URINE MICROSCOPIC-ADD ON

## 2015-02-09 LAB — URINALYSIS, ROUTINE W REFLEX MICROSCOPIC
Bilirubin Urine: NEGATIVE
Glucose, UA: NEGATIVE mg/dL
Ketones, ur: NEGATIVE mg/dL
Leukocytes, UA: NEGATIVE
Nitrite: NEGATIVE
Protein, ur: NEGATIVE mg/dL
Specific Gravity, Urine: 1.02 (ref 1.005–1.030)
Urobilinogen, UA: 0.2 mg/dL (ref 0.0–1.0)
pH: 5.5 (ref 5.0–8.0)

## 2015-02-09 LAB — PREGNANCY, URINE: Preg Test, Ur: NEGATIVE

## 2015-02-09 MED ORDER — OXYCODONE-ACETAMINOPHEN 5-325 MG PO TABS
2.0000 | ORAL_TABLET | Freq: Once | ORAL | Status: AC
  Administered 2015-02-09: 2 via ORAL
  Filled 2015-02-09: qty 2

## 2015-02-09 MED ORDER — OXYCODONE-ACETAMINOPHEN 5-325 MG PO TABS: 1.0000 | ORAL_TABLET | ORAL | Status: DC | PRN

## 2015-02-09 MED ORDER — IBUPROFEN 400 MG PO TABS
600.0000 mg | ORAL_TABLET | Freq: Once | ORAL | Status: AC
  Administered 2015-02-09: 600 mg via ORAL
  Filled 2015-02-09: qty 2

## 2015-02-09 NOTE — ED Notes (Signed)
Pt c/o fever and generalized body aches that started today.  Reports fever 102 and pt took ibuprofen around 1300.

## 2015-02-09 NOTE — Discharge Instructions (Signed)

## 2015-02-10 ENCOUNTER — Ambulatory Visit: Admitting: *Deleted

## 2015-02-10 DIAGNOSIS — Z111 Encounter for screening for respiratory tuberculosis: Secondary | ICD-10-CM

## 2015-02-10 LAB — TB SKIN TEST
INDURATION: 0 mm
TB SKIN TEST: NEGATIVE

## 2015-02-11 ENCOUNTER — Ambulatory Visit (INDEPENDENT_AMBULATORY_CARE_PROVIDER_SITE_OTHER): Admitting: Family Medicine

## 2015-02-11 ENCOUNTER — Encounter: Payer: Self-pay | Admitting: Family Medicine

## 2015-02-11 VITALS — BP 112/68 | HR 84 | Temp 100.4°F | Resp 16 | Ht 62.0 in | Wt 139.0 lb

## 2015-02-11 DIAGNOSIS — B349 Viral infection, unspecified: Secondary | ICD-10-CM

## 2015-02-11 DIAGNOSIS — J039 Acute tonsillitis, unspecified: Secondary | ICD-10-CM | POA: Diagnosis not present

## 2015-02-11 LAB — INFLUENZA A AND B
Inflenza A Ag: NEGATIVE
Influenza B Ag: NEGATIVE

## 2015-02-11 LAB — RAPID STREP SCREEN (MED CTR MEBANE ONLY): STREPTOCOCCUS, GROUP A SCREEN (DIRECT): NEGATIVE

## 2015-02-11 MED ORDER — AMOXICILLIN-POT CLAVULANATE 875-125 MG PO TABS: 1.0000 | ORAL_TABLET | Freq: Two times a day (BID) | ORAL | Status: DC

## 2015-02-11 NOTE — Patient Instructions (Signed)
Take antibiotics Continue fever reducer Gargle salt water F/U as needed

## 2015-02-11 NOTE — Progress Notes (Signed)
Patient ID: Anne Underwood, female   DOB: Jul 22, 1996, 19 y.o.   MRN: [id number]   Subjective:    Patient ID: Anne Underwood, female    DOB: 12/07/1995, 19 y.o.   MRN: [id number]  Patient presents for Illness   Pt here with sore throat, headache, body aches, worsened over past 3 days, seen in ER 2 days ago, CXR neg, UA unremkarbale. Fever Tmax was 102 F, given percocet for pain, told likley flu related. Her throat is getting worse. History of recurrent strep throat      Review Of Systems:  GEN- + fatigue, +fever, weight loss,weakness, recent illness HEENT- denies eye drainage, change in vision, nasal discharge, CVS- denies chest pain, palpitations RESP- denies SOB, cough, wheeze ABD- + N/ denies V, change in stools, abd pain GU- denies dysuria, hematuria, dribbling, incontinence MSK- denies joint pain,+ muscle aches, injury Neuro- +headache, dizziness, syncope, seizure activity       Objective:    BP 112/68 mmHg  Pulse 84  Temp(Src) 100.4 F (38 C) (Oral)  Resp 16  Ht 5\' 2"  (1.575 m)  Wt 139 lb (63.05 kg)  BMI 25.42 kg/m2  LMP 02/09/2015 (Approximate) GEN- NAD, alert and oriented x3,febrile HEENT- PERRL, EOMI, non injected sclera, pink conjunctiva, MMM, oropharynx+ injection, + exudates TM clear bilat no effusion,  No  maxillary sinus tenderness, nares clear Neck- Supple, +LAD CVS- RRR, no murmur RESP-CTAB ABD-NABS,soft,NT,ND EXT- No edema Pulses- Radial 2+         Assessment & Plan:      Problem List Items Addressed This Visit    None    Visit Diagnoses    Tonsillitis with exudate    -  Primary    Augmentin x 10 days, ibupfrofen,started a viral illness, flu negative, rest, fluids    Relevant Orders    Rapid Strep Screen (Completed)    Viral illness        Relevant Orders    Influenza a and b (Completed)       Note: This dictation was prepared with Dragon dictation along with smaller phrase technology. Any transcriptional errors that result from this  process are unintentional.

## 2015-02-18 NOTE — ED Provider Notes (Signed)
CSN: [id number]     Arrival date & time 02/09/15  1724 History   First MD Initiated Contact with Patient 02/09/15 1747     Chief Complaint  Patient presents with  . Generalized Body Aches  . Fever     (Consider location/radiation/quality/duration/timing/severity/associated sxs/prior Treatment) HPI  19yF with fever and body aches. Onset earlier today. Just feels tied and run down. Fever to 102. Took some ibuprofen with some improvement of symptoms. No cough. No sob. No n/v/d. No sore throat. No urinary complaints. No sick contacts.   Past Medical History  Diagnosis Date  . ADD (attention deficit disorder)   . Anxiety   . Pelvic pain in female 10/14/2014  . Ovarian cyst, follicular 10/14/2014  . Family history of adverse reaction to anesthesia     MOTHER HAD PONV   Past Surgical History  Procedure Laterality Date  . Wisdom tooth extraction    . Ovarian cyst removal Right 10/15/2014    Procedure: OVARIAN CYSTECTOMY;  Surgeon: Sherian Rein, MD;  Location: [facility];  Service: Gynecology;  Laterality: Right;  Dr only needs 1hr OR time  . Laparoscopy N/A 10/15/2014    Procedure: LAPAROSCOPY OPERATIVE;  Surgeon: Sherian Rein, MD;  Location: [facility];  Service: Gynecology;  Laterality: N/A;   Family History  Problem Relation Age of Onset  . Healthy Mother   . Cancer Mother 27    Breast Cancer  . Healthy Father   . Cancer Father    History  Substance Use Topics  . Smoking status: Never Smoker   . Smokeless tobacco: Never Used  . Alcohol Use: No   OB History    No data available     Review of Systems  All systems reviewed and negative, other than as noted in HPI.   Allergies  Review of patient's allergies indicates no known allergies.  Home Medications   Prior to Admission medications   Medication Sig Start Date End Date Taking? Authorizing Provider  lisdexamfetamine (VYVANSE) 30 MG capsule Take 1 capsule (30 mg total) by mouth daily. 12/09/14  Yes Donita Brooks, MD  Norgestimate-Ethinyl Estradiol Triphasic (TRI-SPRINTEC) 0.18/0.215/0.25 MG-35 MCG tablet Take 1 tablet by mouth daily. 10/22/14  Yes Salley Scarlet, MD  amoxicillin-clavulanate (AUGMENTIN) 875-125 MG per tablet Take 1 tablet by mouth 2 (two) times daily. 02/11/15   Salley Scarlet, MD  ibuprofen (ADVIL,MOTRIN) 800 MG tablet Take 1 tablet (800 mg total) by mouth every 8 (eight) hours as needed for moderate pain. 10/15/14   Sherian Rein, MD  oxyCODONE-acetaminophen (PERCOCET/ROXICET) 5-325 MG per tablet Take 1 tablet by mouth every 4 (four) hours as needed for severe pain. 02/09/15   Raeford Razor, MD   BP 117/74 mmHg  Pulse 132  Temp(Src) 100.5 F (38.1 C) (Oral)  Resp 16  Ht 5\' 3"  (1.6 m)  Wt 140 lb (63.504 kg)  BMI 24.81 kg/m2  SpO2 100%  LMP 02/09/2015 Physical Exam  Constitutional: She is oriented to person, place, and time. She appears well-developed and well-nourished. No distress.  HENT:  Head: Normocephalic and atraumatic.  Eyes: Conjunctivae are normal. Right eye exhibits no discharge. Left eye exhibits no discharge.  Neck: Neck supple.  Cardiovascular: Normal rate, regular rhythm and normal heart sounds.  Exam reveals no gallop and no friction rub.   No murmur heard. Pulmonary/Chest: Effort normal and breath sounds normal. No respiratory distress.  Abdominal: Soft. She exhibits no distension. There is no tenderness.  Musculoskeletal: She exhibits no edema or  tenderness.  Neurological: She is alert and oriented to person, place, and time.  Skin: Skin is warm and dry.  Psychiatric: She has a normal mood and affect. Her behavior is normal. Thought content normal.  Nursing note and vitals reviewed.   ED Course  Procedures (including critical care time) Labs Review Labs Reviewed  URINALYSIS, ROUTINE W REFLEX MICROSCOPIC (NOT AT [facility]) - Abnormal; Notable for the following:    Hgb urine dipstick LARGE (*)    All other components within normal limits   URINE MICROSCOPIC-ADD ON - Abnormal; Notable for the following:    Squamous Epithelial / LPF MANY (*)    All other components within normal limits  PREGNANCY, URINE    Imaging Review No results found.   EKG Interpretation None      MDM   Final diagnoses:  Fever  Body aches    19 year old female with fever and body aches. Suspect viral illness. Low suspicion for SBI. Nontoxic. Hr significantly improved. HD stable. It has been determined that no acute conditions requiring further emergency intervention are present at this time. The patient has been advised of the diagnosis and plan. I reviewed any labs and imaging including any potential incidental findings. We have discussed signs and symptoms that warrant return to the ED and they are listed in the discharge instructions.      Raeford Razor, MD 02/19/15 [phone]

## 2015-06-03 ENCOUNTER — Ambulatory Visit (INDEPENDENT_AMBULATORY_CARE_PROVIDER_SITE_OTHER): Admitting: Family Medicine

## 2015-06-03 ENCOUNTER — Encounter: Payer: Self-pay | Admitting: Family Medicine

## 2015-06-03 VITALS — BP 102/60 | HR 80 | Temp 98.3°F | Resp 14 | Ht 62.0 in | Wt 146.0 lb

## 2015-06-03 DIAGNOSIS — K589 Irritable bowel syndrome without diarrhea: Secondary | ICD-10-CM | POA: Diagnosis not present

## 2015-06-03 DIAGNOSIS — R197 Diarrhea, unspecified: Secondary | ICD-10-CM | POA: Diagnosis not present

## 2015-06-03 DIAGNOSIS — R1084 Generalized abdominal pain: Secondary | ICD-10-CM | POA: Diagnosis not present

## 2015-06-03 LAB — URINALYSIS, ROUTINE W REFLEX MICROSCOPIC
BILIRUBIN URINE: NEGATIVE
GLUCOSE, UA: NEGATIVE
KETONES UR: NEGATIVE
Leukocytes, UA: NEGATIVE
Nitrite: NEGATIVE
PROTEIN: NEGATIVE
Specific Gravity, Urine: 1.025 (ref 1.001–1.035)
pH: 6 (ref 5.0–8.0)

## 2015-06-03 LAB — URINALYSIS, MICROSCOPIC ONLY
Casts: NONE SEEN [LPF]
Crystals: NONE SEEN [HPF]
Yeast: NONE SEEN [HPF]

## 2015-06-03 MED ORDER — DICYCLOMINE HCL 10 MG PO CAPS: 10.0000 mg | ORAL_CAPSULE | Freq: Three times a day (TID) | ORAL | Status: DC

## 2015-06-03 MED ORDER — ALIGN PO CAPS
1.0000 | ORAL_CAPSULE | Freq: Every day | ORAL | Status: DC
Start: 2015-06-03 — End: 2016-09-28

## 2015-06-03 NOTE — Patient Instructions (Addendum)
Try probiotics - Align  Stool Cultures  Labs to be down   Take Bentyl with meals as needed for spasm and pain F/U Pending results

## 2015-06-03 NOTE — Progress Notes (Signed)
Patient ID: Anne Underwood, female   DOB: 05/18/1996, 19 y.o.   MRN: [id number]   Subjective:    Patient ID: Anne Underwood, female    DOB: 18-Dec-1995, 19 y.o.   MRN: [id number]  Patient presents for Abdominal Pain  patient here with recurrent episodes of intermittent abdominal pain nausea and diarrhea. It typically happens every time she eats especially heavy meal spicy meal. She will have to run to the bathroom directly after and have a loose stool. She was evaluated back in January 2015 for this all of her labs were normal there is no evidence of gluten insensitivity or colitis. She had right upper quadrant ultrasound done which was negative. She denies any abdominal pain today states the past 2 days she's been fine but A, hips are out of nowhere. Denies any blood in the stool. Her mother is also having some gastrointestinal issues. Her menstrual cycle is regular    Review Of Systems:  GEN- denies fatigue, fever, weight loss,weakness, recent illness CVS- denies chest pain, palpitations RESP- denies SOB, cough, wheeze ABD- denies N/V, +change in stools,+ abd pain GU- denies dysuria, hematuria, dribbling, incontinence        Objective:    BP 102/60 mmHg  Pulse 80  Temp(Src) 98.3 F (36.8 C) (Oral)  Resp 14  Ht  (1.575 m)  Wt 146 lb (66.225 kg)  BMI 26.70 kg/m2  LMP 05/23/2015 GEN- NAD, alert and oriented x3, weight gain HEENT- PERRL, EOMI, non injected sclera, pink conjunctiva, MMM, oropharynx clear Neck- Supple, no thyromegaly CVS- RRR, no murmur RESP-CTAB ABD-NABS,soft,NT,ND EXT- No edema Pulses- Radial - 2+        Assessment & Plan:      Problem List Items Addressed This Visit    None    Visit Diagnoses    Generalized abdominal pain    -  Primary    Relevant Orders    Urinalysis, Routine w reflex microscopic (not at [facility]) (Completed)    IBS (irritable bowel syndrome)        Based on history, previous evaluation concern for more IBS picture, will give  [place] probiotic, she will return stool studies, consider GI pending workup    Relevant Medications    bifidobacterium infantis (ALIGN) capsule    dicyclomine (BENTYL) 10 MG capsule    Diarrhea, unspecified type        Relevant Orders    Stool culture    Fecal leukocytes    Ova and Parasite Examination    Fecal occult blood, imunochemical    CBC with Differential/Platelet    Comprehensive metabolic panel    Sedimentation Rate    C-reactive protein       Note: This dictation was prepared with Dragon dictation along with smaller phrase technology. Any transcriptional errors that result from this process are unintentional.

## 2015-06-04 LAB — CBC WITH DIFFERENTIAL/PLATELET
Basophils Absolute: 0 10*3/uL (ref 0.0–0.1)
Basophils Relative: 0 % (ref 0–1)
EOS ABS: 0.3 10*3/uL (ref 0.0–0.7)
EOS PCT: 3 % (ref 0–5)
HEMATOCRIT: 36.7 % (ref 36.0–46.0)
Hemoglobin: 12 g/dL (ref 12.0–15.0)
LYMPHS ABS: 2.7 10*3/uL (ref 0.7–4.0)
LYMPHS PCT: 29 % (ref 12–46)
MCH: 28 pg (ref 26.0–34.0)
MCHC: 32.7 g/dL (ref 30.0–36.0)
MCV: 85.7 fL (ref 78.0–100.0)
MPV: 11 fL (ref 8.6–12.4)
Monocytes Absolute: 0.6 10*3/uL (ref 0.1–1.0)
Monocytes Relative: 6 % (ref 3–12)
Neutro Abs: 5.8 10*3/uL (ref 1.7–7.7)
Neutrophils Relative %: 62 % (ref 43–77)
PLATELETS: 225 10*3/uL (ref 150–400)
RBC: 4.28 MIL/uL (ref 3.87–5.11)
RDW: 14.7 % (ref 11.5–15.5)
WBC: 9.3 10*3/uL (ref 4.0–10.5)

## 2015-06-04 LAB — COMPREHENSIVE METABOLIC PANEL
ALK PHOS: 39 U/L (ref 33–115)
ALT: 17 U/L (ref 6–29)
AST: 17 U/L (ref 10–30)
Albumin: 3.7 g/dL (ref 3.6–5.1)
BUN: 10 mg/dL (ref 7–25)
CO2: 27 mmol/L (ref 20–31)
Calcium: 8.9 mg/dL (ref 8.6–10.2)
Chloride: 107 mmol/L (ref 98–110)
Creat: 0.76 mg/dL (ref 0.50–1.10)
GLUCOSE: 65 mg/dL — AB (ref 70–99)
POTASSIUM: 4.3 mmol/L (ref 3.5–5.3)
Sodium: 142 mmol/L (ref 135–146)
Total Bilirubin: 0.4 mg/dL (ref 0.2–1.2)
Total Protein: 6.6 g/dL (ref 6.1–8.1)

## 2015-06-04 LAB — C-REACTIVE PROTEIN: CRP: <0.5 mg/dL (ref <0.60)

## 2015-06-04 LAB — SEDIMENTATION RATE: Sed Rate: 1 mm/hr (ref 0–20)

## 2015-06-06 ENCOUNTER — Telehealth: Payer: Self-pay | Admitting: Family Medicine

## 2015-06-06 NOTE — Telephone Encounter (Signed)
Pt left a voicemail only requesting to speak with Dr. Jeanice Lim or a nurse. Her phone number is [phone]

## 2015-06-07 ENCOUNTER — Other Ambulatory Visit

## 2015-06-07 DIAGNOSIS — R197 Diarrhea, unspecified: Secondary | ICD-10-CM

## 2015-06-07 NOTE — Telephone Encounter (Signed)
Returned call to patient.   Patient had question about proper collection of stool sample.   Advised how to collect and return sample.

## 2015-06-08 LAB — FECAL LACTOFERRIN, QUANT: Lactoferrin: NEGATIVE

## 2015-06-08 LAB — FECAL OCCULT BLOOD, IMMUNOCHEMICAL: FECAL OCCULT BLOOD: NEGATIVE

## 2015-06-08 LAB — OVA AND PARASITE EXAMINATION: OP: NONE SEEN

## 2015-06-11 LAB — STOOL CULTURE

## 2015-10-03 ENCOUNTER — Other Ambulatory Visit: Payer: Self-pay | Admitting: Family Medicine

## 2015-10-03 NOTE — Telephone Encounter (Signed)
Refill appropriate and filled per protocol. 

## 2015-10-31 ENCOUNTER — Other Ambulatory Visit: Payer: Self-pay | Admitting: Family Medicine

## 2015-10-31 NOTE — Telephone Encounter (Signed)
Refill appropriate and filled per protocol. 

## 2016-09-28 ENCOUNTER — Ambulatory Visit (INDEPENDENT_AMBULATORY_CARE_PROVIDER_SITE_OTHER): Admitting: Family Medicine

## 2016-09-28 ENCOUNTER — Telehealth: Payer: Self-pay | Admitting: Family Medicine

## 2016-09-28 ENCOUNTER — Encounter: Payer: Self-pay | Admitting: Family Medicine

## 2016-09-28 VITALS — BP 124/70 | HR 80 | Temp 98.6°F | Resp 14 | Ht 62.0 in | Wt 142.0 lb

## 2016-09-28 DIAGNOSIS — Z8742 Personal history of other diseases of the female genital tract: Secondary | ICD-10-CM

## 2016-09-28 DIAGNOSIS — R102 Pelvic and perineal pain: Secondary | ICD-10-CM

## 2016-09-28 DIAGNOSIS — N83209 Unspecified ovarian cyst, unspecified side: Secondary | ICD-10-CM

## 2016-09-28 DIAGNOSIS — Z3009 Encounter for other general counseling and advice on contraception: Secondary | ICD-10-CM | POA: Diagnosis not present

## 2016-09-28 LAB — PREGNANCY, URINE: PREG TEST UR: NEGATIVE

## 2016-09-28 MED ORDER — NORETHINDRONE ACET-ETHINYL EST 1-20 MG-MCG PO TABS: 1.0000 | ORAL_TABLET | Freq: Every day | ORAL | 11 refills | Status: DC

## 2016-09-28 NOTE — Patient Instructions (Signed)
Ultrasound needed for cyst Try the new birth control F/U as needed

## 2016-09-28 NOTE — Telephone Encounter (Signed)
Mother calling back.  Does want to go ahead and get ultrasound done.  Order placed.  Pt schedule for [facility] Monday and mother is aware

## 2016-09-28 NOTE — Telephone Encounter (Signed)
Noted  

## 2016-09-28 NOTE — Progress Notes (Signed)
   Subjective:    Patient ID: Anne MallowHalie M Schenk, female    DOB: 1996-04-21, 21 y.o.   MRN: [id number]  Patient presents for Contraception Management (would like to switch oral BC- states that she stopped taking TriSprintec D/T increased depression)  Patient here to discuss her birth control. She stopped it about a month ago. After she was unable to get refills. She had been noticing that her mood had been changing she's had more mood swings and sometimes felt depressed. States that since she has been off of it she has felt normal. She would like to try something different. She not have any problems with the regular cycles everything was regulated while on the birth control. She is sexually active. She would like to go back on a pill form for contraception. She also has history of ovarian cysts in the right side. She's been having some intermittent sharp pains flipping back and forth at various times. Sometimes in the middle for cycle other times she does not recall. She did not have any follow-up with GYN after she had a cyst removal from her right ovary back in 2016. No vaginal discharge, no change in bowel or bladder Ended menstrual cycle today  Review Of Systems:  GEN- denies fatigue, fever, weight loss,weakness, recent illness HEENT- denies eye drainage, change in vision, nasal discharge, CVS- denies chest pain, palpitations RESP- denies SOB, cough, wheeze ABD- denies N/V, change in stools, abd pain GU- denies dysuria, hematuria, dribbling, incontinence MSK- denies joint pain, muscle aches, injury Neuro- denies headache, dizziness, syncope, seizure activity       Objective:    BP 124/70 (BP Location: Left Arm, Patient Position: Sitting, Cuff Size: Normal)   Pulse 80   Temp 98.6 F (37 C) (Oral)   Resp 14   Ht 5\' 2"  (1.575 m)   Wt 142 lb (64.4 kg)   LMP 09/24/2016 Comment: regular  SpO2 98%   BMI 25.97 kg/m  GEN- NAD, alert and oriented x3 HEENT- PERRL, EOMI, non injected sclera,  pink conjunctiva, MMM, oropharynx clear Neck- Supple, no thyromegaly CVS- RRR, no murmur RESP-CTAB ABD-NABS,soft,mild TTP left pelvic region, no rebound no guarding,no CVA tenderness  Psych- normal affect and mood  EXT- No edema Pulses- Radial 2+        Assessment & Plan:      Problem List Items Addressed This Visit    None    Visit Diagnoses    Encounter for general counseling on prescription of oral contraceptives    -  Primary   Will try a lower hormone formulation, Loestrin sent, continue condoms for STD protection.    Relevant Orders   Pregnancy, urine (Completed)   Cyst of ovary, unspecified laterality       Recommend rechecking with Ultrasound, she is going to figure out her schedule to see if she wants to just get US locally or get appt with GYN,we will await her decision      Note: This dictation was prepared with Dragon dictation along with smaller phrase technology. Any transcriptional errors that result from this process are unintentional.

## 2016-10-01 ENCOUNTER — Ambulatory Visit (HOSPITAL_COMMUNITY)
Admission: RE | Admit: 2016-10-01 | Discharge: 2016-10-01 | Disposition: A | Discharge status: Home / Self Care | Source: Ambulatory Visit | Attending: Family Medicine | Admitting: Family Medicine

## 2016-10-01 DIAGNOSIS — Z8742 Personal history of other diseases of the female genital tract: Secondary | ICD-10-CM | POA: Diagnosis present

## 2016-10-01 DIAGNOSIS — R102 Pelvic and perineal pain: Secondary | ICD-10-CM | POA: Diagnosis present

## 2017-01-09 ENCOUNTER — Other Ambulatory Visit: Payer: Self-pay | Admitting: Family Medicine

## 2017-02-12 ENCOUNTER — Ambulatory Visit (INDEPENDENT_AMBULATORY_CARE_PROVIDER_SITE_OTHER)

## 2017-02-12 DIAGNOSIS — Z111 Encounter for screening for respiratory tuberculosis: Secondary | ICD-10-CM | POA: Diagnosis not present

## 2017-02-12 NOTE — Progress Notes (Signed)
Pt received Tb skin test in left arm. Pt tolerated well

## 2017-02-14 ENCOUNTER — Ambulatory Visit: Admitting: *Deleted

## 2017-02-14 DIAGNOSIS — Z111 Encounter for screening for respiratory tuberculosis: Secondary | ICD-10-CM

## 2017-02-14 LAB — TB SKIN TEST
Induration: 0 mm
TB SKIN TEST: NEGATIVE

## 2017-04-26 ENCOUNTER — Ambulatory Visit (INDEPENDENT_AMBULATORY_CARE_PROVIDER_SITE_OTHER): Admitting: Family Medicine

## 2017-04-26 ENCOUNTER — Encounter: Payer: Self-pay | Admitting: Family Medicine

## 2017-04-26 ENCOUNTER — Encounter: Payer: Self-pay | Admitting: *Deleted

## 2017-04-26 VITALS — BP 122/68 | HR 82 | Temp 98.0°F | Resp 14 | Ht 62.0 in | Wt 152.0 lb

## 2017-04-26 DIAGNOSIS — R0789 Other chest pain: Secondary | ICD-10-CM | POA: Diagnosis not present

## 2017-04-26 MED ORDER — OMEPRAZOLE 40 MG PO CPDR: 40.0000 mg | DELAYED_RELEASE_CAPSULE | Freq: Every day | ORAL | 3 refills | Status: DC

## 2017-04-26 NOTE — Patient Instructions (Addendum)
Take the omeprazole once a day for 2 weeks  Call if not improved

## 2017-04-26 NOTE — Progress Notes (Signed)
   Subjective:    Patient ID: Anne Underwood, female    DOB: 1996-04-27, 21 y.o.   MRN: [id number]  Patient presents for Chest Pain (- states that [facility] and then [facility] [place] [place] to R/O cardiac causes- reports that it feels more like pressure)   Patient here with chest discomfort for the past week. States it started after she ate something and she lay down directly at [facility] reflux symptoms did not go away. She will feel more pressure like something was sitting on her chest no sharp pains felt difficult to take a deep breath. She was allergic care date and EKG total was normal within there was concern about possible blood clot with her family history as she was sent to the emergency room. She had labs drawn which she states were normal and she had a CT angiogram which was also normal. She does have underlying ear bowel syndrome states that she had more GI upset having to run to the bathroom right after the ER but she thinks she was just anxious and she does have a [place] underlying anxiety. She denies any panic attacks. Denies any recent illness. We'll sitting in the exam room states that she felt a pressure in  Chest while she was sitting there.    Review Of Systems:  GEN- denies fatigue, fever, weight loss,weakness, recent illness HEENT- denies eye drainage, change in vision, nasal discharge, CVS- +chest pain, palpitations RESP- denies SOB, cough, wheeze ABD- denies N/V, change in stools, abd pain GU- denies dysuria, hematuria, dribbling, incontinence MSK- denies joint pain, muscle aches, injury Neuro- denies headache, dizziness, syncope, seizure activity       Objective:    BP 122/68   Pulse 82   Temp 98 F (36.7 C) (Oral)   Resp 14   Ht 5\' 2"  (1.575 m)   Wt 152 lb (68.9 kg)   SpO2 98%   BMI 27.80 kg/m  GEN- NAD, alert and oriented x3 HEENT- PERRL, EOMI, non injected sclera, pink conjunctiva, MMM, oropharynx clear Neck- Supple CVS- RRR, no murmur,  chest wall NT  RESP-CTAB ABD-NABS,soft,NT,ND EXT- No edema Psych- normal affect and mood Pulses- Radial 2+  EKG- NSR, no ST changes       Assessment & Plan:      Problem List Items Addressed This Visit    None    Visit Diagnoses    Atypical chest pain    -  Primary   Doubt cardiac cause, no lung pathology or illness, will try treating GI/reflux with omeprazole for 2 weeks. Anxiety is still underlying and may be contributing but she declines more medications   Relevant Orders   EKG 12-Lead (Completed)      Note: This dictation was prepared with Dragon dictation along with smaller phrase technology. Any transcriptional errors that result from this process are unintentional.

## 2017-06-04 ENCOUNTER — Telehealth: Payer: Self-pay | Admitting: Family Medicine

## 2017-06-04 NOTE — Telephone Encounter (Signed)
Error

## 2017-06-10 ENCOUNTER — Ambulatory Visit: Admitting: Family Medicine

## 2017-06-11 ENCOUNTER — Ambulatory Visit (INDEPENDENT_AMBULATORY_CARE_PROVIDER_SITE_OTHER): Admitting: Family Medicine

## 2017-06-11 ENCOUNTER — Encounter: Payer: Self-pay | Admitting: Family Medicine

## 2017-06-11 VITALS — BP 120/70 | HR 78 | Temp 98.2°F | Resp 14 | Ht 62.0 in | Wt 149.0 lb

## 2017-06-11 DIAGNOSIS — R0789 Other chest pain: Secondary | ICD-10-CM

## 2017-06-11 DIAGNOSIS — R002 Palpitations: Secondary | ICD-10-CM

## 2017-06-11 NOTE — Assessment & Plan Note (Signed)
Repeat EKG in office, NSR, no changes from previous, check TSH, send to cardiology for event monitor as well. as episodes very short lived hold on beta blocker

## 2017-06-11 NOTE — Progress Notes (Signed)
   Subjective:    Patient ID: Anne Underwood, female    DOB: [birth year], 21 y.o.   MRN: [id number]  Patient presents for Atypical Chest Pain (reports that she has intermittent episodes of tachycardia and pain- currenlty only has tight feeling to chest)  Here with recurrent chest discomfort and pressure she has had 2 different episodes where she has severe palpitations felt like her heart was beating out of her chest. One time she was just in the bathtub, the second was during intercourse. She denies any shortness of breath no GI symptoms. The proton pump inhibitor did not help. I reviewed her labs from her emergency room visit when this all started back in August. She cannot pinpoint it continues specific to cause her symptoms   Review Of Systems:  GEN- denies fatigue, fever, weight loss,weakness, recent illness HEENT- denies eye drainage, change in vision, nasal discharge, CVS- denies chest pain, +palpitations RESP- denies SOB, cough, wheeze ABD- denies N/V, change in stools, abd pain GU- denies dysuria, hematuria, dribbling, incontinence MSK- denies joint pain, muscle aches, injury Neuro- denies headache, dizziness, syncope, seizure activity       Objective:    BP 120/70   Pulse 78   Temp 98.2 F (36.8 C) (Oral)   Resp 14   Ht  (1.575 m)   Wt 149 lb (67.6 kg)   SpO2 98%   BMI 27.25 kg/m  GEN- NAD, alert and oriented x3 HEENT- PERRL, EOMI, non injected sclera, pink conjunctiva, MMM, oropharynx clear Neck- Supple, no thyromegaly CVS- RRR, no murmur RESP-CTAB ABD-NABS,soft,NT,ND Psych- normal affect and mood  EXT- No edema Pulses- Radial, DP- 2+        Assessment & Plan:      Problem List Items Addressed This Visit      Unprioritized   Palpitations    Repeat EKG in office, NSR, no changes from previous, check TSH, send to cardiology for event monitor as well. as episodes very short lived hold on beta blocker      Relevant Orders   EKG 12-Lead (Completed)    Ambulatory referral to Cardiology   Basic metabolic panel   TSH    Other Visit Diagnoses    Atypical chest pain    -  Primary   Relevant Orders   EKG 12-Lead (Completed)   Ambulatory referral to Cardiology      Note: This dictation was prepared with Dragon dictation along with smaller phrase technology. Any transcriptional errors that result from this process are unintentional.

## 2017-06-11 NOTE — Patient Instructions (Signed)
Referral to cardiology  F/U pending results

## 2017-06-12 LAB — BASIC METABOLIC PANEL
BUN: 9 mg/dL (ref 7–25)
CO2: 23 mmol/L (ref 20–32)
CREATININE: 0.74 mg/dL (ref 0.50–1.10)
Calcium: 8.7 mg/dL (ref 8.6–10.2)
Chloride: 105 mmol/L (ref 98–110)
GLUCOSE: 71 mg/dL (ref 65–99)
Potassium: 4.3 mmol/L (ref 3.5–5.3)
Sodium: 138 mmol/L (ref 135–146)

## 2017-06-12 LAB — TSH: TSH: 0.71 mIU/L

## 2017-06-17 ENCOUNTER — Other Ambulatory Visit: Payer: Self-pay | Admitting: Family Medicine

## 2017-06-28 ENCOUNTER — Encounter: Payer: Self-pay | Admitting: Cardiovascular Disease

## 2017-06-28 ENCOUNTER — Ambulatory Visit (HOSPITAL_COMMUNITY)
Admission: RE | Admit: 2017-06-28 | Discharge: 2017-06-28 | Disposition: A | Discharge status: Home / Self Care | Source: Ambulatory Visit | Attending: Cardiovascular Disease | Admitting: Cardiovascular Disease

## 2017-06-28 ENCOUNTER — Ambulatory Visit (INDEPENDENT_AMBULATORY_CARE_PROVIDER_SITE_OTHER): Payer: BLUE CROSS/BLUE SHIELD | Admitting: Cardiovascular Disease

## 2017-06-28 VITALS — BP 110/78 | HR 90 | Ht 63.0 in | Wt 149.0 lb

## 2017-06-28 DIAGNOSIS — R002 Palpitations: Secondary | ICD-10-CM | POA: Diagnosis present

## 2017-06-28 DIAGNOSIS — R079 Chest pain, unspecified: Secondary | ICD-10-CM | POA: Diagnosis present

## 2017-06-28 LAB — ECHOCARDIOGRAM COMPLETE
Height: 63 in
Weight: 2384 oz

## 2017-06-28 NOTE — Progress Notes (Signed)
CARDIOLOGY CONSULT NOTE  Patient ID: XITLALLI NEWHARD MRN: [id number] DOB/AGE: 30-Jun-1996 21 y.o.  Admit date: (Not on file) Primary Physician: Salley Scarlet, MD Referring Physician: Dr. Jeanice Lim  Reason for Consultation: Chest pain  HPI: Anne Underwood is a 21 y.o. female who is being seen today for the evaluation of chest pain at the request of Belleair Shore, Velna Hatchet, MD.   She had been complaining of recurrent chest discomfort and palpitations to her PCP.  One episode occurred while she was bathing and the second episode occurred during intercourse.   Basic metabolic panel and TSH on 06/11/17 were reviewed by me and found to be normal.  A chest x-ray in June 2016 was normal.  ECG performed on 06/11/17 which I personally reviewed demonstrated normal sinus rhythm with no ischemic ST segment or T wave abnormalities, nor any arrhythmias.  Upon speaking with her further, she began noticing symptoms approximately 2 months ago.  One episode occurred while she was in the bathtub and said "my heart started beating out of my chest".  Another episode occurred during intercourse.  She said it was sudden in onset and also stopped suddenly with each episode lasting approximately 2-3 minutes.  It was accompanied by chest heaviness in the retrosternal region which lasted for about 2 days.  She was very active in high school and played soccer.  She did notice episodic palpitations but says she probably did not pay much attention to it at that time.  She has separate episodic sharp chest pains which last seconds also located retrosternally and on the left side of her chest.  During both episodes of palpitations, she felt short of breath and felt like she might pass out.  She denies leg swelling, orthopnea, and paroxysmal nocturnal dyspnea.  Family history: Internal grandfather died at age 27 of heart disease.  Paternal aunt also died of coronary artery disease in her late 40s/early 74s.  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  No Known Allergies  Current Outpatient Prescriptions  Medication Sig Dispense Refill  . ibuprofen (ADVIL,MOTRIN) 800 MG tablet Take 1 tablet (800 mg total) by mouth every 8 (eight) hours as needed for moderate pain. 40 tablet 1  . norethindrone-ethinyl estradiol (MICROGESTIN,JUNEL,LOESTRIN) 1-20 MG-MCG tablet TAKE (1) TABLET BY MOUTH ONCE DAILY. 63 tablet 0   No current facility-administered medications for this visit.     Past Medical History:  Diagnosis Date  . ADD (attention deficit disorder)   . Anxiety   . Family history of adverse reaction to anesthesia    MOTHER HAD PONV  . Ovarian cyst, follicular 10/14/2014  . Pelvic pain in female 10/14/2014    Past Surgical History:  Procedure Laterality Date  . LAPAROSCOPY N/A 10/15/2014   Procedure: LAPAROSCOPY OPERATIVE;  Surgeon: Sherian Rein, MD;  Location: [facility];  Service: Gynecology;  Laterality: N/A;  . OVARIAN CYST REMOVAL Right 10/15/2014   Procedure: OVARIAN CYSTECTOMY;  Surgeon: Sherian Rein, MD;  Location: [facility];  Service: Gynecology;  Laterality: Right;  Dr only needs 1hr OR time  . WISDOM TOOTH EXTRACTION      Social History   Social History  . Marital status: Single    Spouse name: N/A  . Number of children: N/A  . Years of education: N/A   Occupational History  . Not on file.   Social History Main Topics  . Smoking status: Never Smoker  . Smokeless tobacco: Never Used  . Alcohol use  No  . Drug use: No  . Sexual activity: Yes    Birth control/ protection: Pill   Other Topics Concern  . Not on file   Social History Narrative  . No narrative on file     No family history of premature CAD in 1st degree relatives.  Current Meds  Medication Sig  . ibuprofen (ADVIL,MOTRIN) 800 MG tablet Take 1 tablet (800 mg total) by mouth every 8 (eight) hours as needed for moderate pain.  Marland Kitchen. norethindrone-ethinyl estradiol (MICROGESTIN,JUNEL,LOESTRIN) 1-20  MG-MCG tablet TAKE (1) TABLET BY MOUTH ONCE DAILY.      Review of systems complete and found to be negative unless listed above in HPI    Physical exam Blood pressure 110/78, pulse 90, height 5\' 3"  (1.6 m), weight 149 lb (67.6 kg), last menstrual period 06/03/2017, SpO2 96 %. General: NAD Neck: No JVD, no thyromegaly or thyroid nodule.  Lungs: Clear to auscultation bilaterally with normal respiratory effort. CV: Nondisplaced PMI. Regular rate and rhythm, normal S1/S2, no S3/S4, no murmur.  No peripheral edema.  No carotid bruit.    Abdomen: Soft, nontender, no distention.  Skin: Intact without lesions or rashes.  Neurologic: Alert and oriented x 3.  Psych: Normal affect. Extremities: No clubbing or cyanosis.  HEENT: Normal.   ECG: Most recent ECG reviewed.   Labs: Lab Results  Component Value Date/Time   K 4.3 06/11/2017 04:34 PM   BUN 9 06/11/2017 04:34 PM   CREATININE 0.74 06/11/2017 04:34 PM   ALT 17 06/03/2015 03:16 PM   TSH 0.71 06/11/2017 04:34 PM   HGB 12.0 06/03/2015 03:16 PM   HGB 12.6 02/16/2013 05:04 PM     Lipids: No results found for: LDLCALC, LDLDIRECT, CHOL, TRIG, HDL      ASSESSMENT AND PLAN:  1.  Tachycardia with palpitations and chest pain: I wonder if she has a tachyarrhythmia such as SVT, given its abrupt onset and cessation.  I will obtain a 3-week event monitor to assess for this.  I will also obtain an echocardiogram to evaluate cardiac structure and function.  She has a very low pretest probability of coronary artery disease, thus stress testing is not indicated.     Disposition: Follow up in 6 weeks.   Signed: Prentice DockerSuresh Koneswaran, M.D., F.A.C.C.  06/28/2017, 9:29 AM

## 2017-06-28 NOTE — Progress Notes (Signed)
*  PRELIMINARY RESULTS* Echocardiogram 2D Echocardiogram has been performed.  Jeryl Columbialliott, Alfonsa Vaile 06/28/2017, 11:13 AM

## 2017-06-28 NOTE — Patient Instructions (Signed)
Your physician recommends that you schedule a follow-up appointment in:  6 weeks with Dr.Koneswaran    Your physician has recommended that you wear an event monitor. Event monitors are medical devices that record the heart's electrical activity. Doctors most often us these monitors to diagnose arrhythmias. Arrhythmias are problems with the speed or rhythm of the heartbeat. The monitor is a small, portable device. You can wear one while you do your normal daily activities. This is usually used to diagnose what is causing palpitations/syncope (passing out).  Your physician has requested that you have an echocardiogram. Echocardiography is a painless test that uses sound waves to create images of your heart. It provides your doctor with information about the size and shape of your heart and how well your heart's chambers and valves are working. This procedure takes approximately one hour. There are no restrictions for this procedure.   Your physician recommends that you continue on your current medications as directed. Please refer to the Current Medication list given to you today.   No lab work ordered today       Thank you for choosing [facility] !

## 2017-07-03 ENCOUNTER — Ambulatory Visit (INDEPENDENT_AMBULATORY_CARE_PROVIDER_SITE_OTHER): Payer: BLUE CROSS/BLUE SHIELD

## 2017-07-03 DIAGNOSIS — R079 Chest pain, unspecified: Secondary | ICD-10-CM

## 2017-07-03 DIAGNOSIS — R002 Palpitations: Secondary | ICD-10-CM | POA: Diagnosis not present

## 2017-07-04 NOTE — Addendum Note (Signed)
Addended by: Marlyn CorporalARLTON, Tierria Watson A on: 07/04/2017 07:36 AM   Modules accepted: Orders

## 2017-07-15 ENCOUNTER — Telehealth: Payer: Self-pay | Admitting: Cardiovascular Disease

## 2017-07-15 NOTE — Telephone Encounter (Signed)
Patient had rash which made her skin bleed when she removed electrodes. I spoke with Mitch at [employer] and informed him patient is shipping monitor back

## 2017-07-15 NOTE — Telephone Encounter (Signed)
Pt is having an allergic reaction to the stickers from her monitor --please give her a call

## 2017-08-17 ENCOUNTER — Other Ambulatory Visit: Payer: Self-pay | Admitting: Family Medicine

## 2017-08-30 ENCOUNTER — Ambulatory Visit: Admitting: Cardiovascular Disease

## 2017-09-27 ENCOUNTER — Encounter: Payer: Self-pay | Admitting: Cardiovascular Disease

## 2017-09-27 ENCOUNTER — Ambulatory Visit (INDEPENDENT_AMBULATORY_CARE_PROVIDER_SITE_OTHER): Payer: BLUE CROSS/BLUE SHIELD | Admitting: Cardiovascular Disease

## 2017-09-27 VITALS — BP 98/58 | HR 69 | Ht 63.0 in | Wt 149.0 lb

## 2017-09-27 DIAGNOSIS — R079 Chest pain, unspecified: Secondary | ICD-10-CM

## 2017-09-27 DIAGNOSIS — R002 Palpitations: Secondary | ICD-10-CM | POA: Diagnosis not present

## 2017-09-27 NOTE — Patient Instructions (Signed)

## 2017-09-27 NOTE — Progress Notes (Signed)
SUBJECTIVE: The patient returns for follow-up after undergoing cardiovascular testing performed for the evaluation of chest pain.  Echocardiogram on 06/28/17 was normal, LVEF 50-55%.  Event monitoring demonstrated sinus rhythm with sinus tachycardia.  Most of her symptoms correlated with sinus rhythm.  She has had 2 or 3 more episodes since her last visit with me.  One episode awoke her from sleep.  She had sharp chest pain and then caught her breath and symptoms quickly resolved.  It lasted less than 30 seconds.  2 additional episodes she believes occurred while she was sitting down.  She denies exertional chest pain and exertional dyspnea as well as tingling or numbness around her mouth, hands, and feet.  She does admit to a bit of anxiety but denies correlation of anxious episodes with tachycardia and chest pain.   Review of Systems: As per "subjective", otherwise negative.  No Known Allergies  Current Outpatient Medications  Medication Sig Dispense Refill  . ibuprofen (ADVIL,MOTRIN) 800 MG tablet Take 1 tablet (800 mg total) by mouth every 8 (eight) hours as needed for moderate pain. 40 tablet 1  . norethindrone-ethinyl estradiol (MICROGESTIN,JUNEL,LOESTRIN) 1-20 MG-MCG tablet TAKE (1) TABLET BY MOUTH ONCE DAILY. 63 tablet 0   No current facility-administered medications for this visit.     Past Medical History:  Diagnosis Date  . ADD (attention deficit disorder)   . Anxiety   . Family history of adverse reaction to anesthesia    MOTHER HAD PONV  . Ovarian cyst, follicular 10/14/2014  . Pelvic pain in female 10/14/2014    Past Surgical History:  Procedure Laterality Date  . LAPAROSCOPY N/A 10/15/2014   Procedure: LAPAROSCOPY OPERATIVE;  Surgeon: Sherian ReinJody Bovard-Stuckert, MD;  Location: [facility];  Service: Gynecology;  Laterality: N/A;  . OVARIAN CYST REMOVAL Right 10/15/2014   Procedure: OVARIAN CYSTECTOMY;  Surgeon: Sherian ReinJody Bovard-Stuckert, MD;  Location: [facility];  Service:  Gynecology;  Laterality: Right;  Dr only needs 1hr OR time  . WISDOM TOOTH EXTRACTION      Social History   Socioeconomic History  . Marital status: Single    Spouse name: Not on file  . Number of children: Not on file  . Years of education: Not on file  . Highest education level: Not on file  Social Needs  . Financial resource strain: Not on file  . Food insecurity - worry: Not on file  . Food insecurity - inability: Not on file  . Transportation needs - medical: Not on file  . Transportation needs - non-medical: Not on file  Occupational History  . Not on file  Tobacco Use  . Smoking status: Never Smoker  . Smokeless tobacco: Never Used  Substance and Sexual Activity  . Alcohol use: No  . Drug use: No  . Sexual activity: Yes    Birth control/protection: Pill  Other Topics Concern  . Not on file  Social History Narrative  . Not on file     Vitals:   09/27/17 1615  BP: (!) 98/58  Pulse: 69  SpO2: 96%  Weight: 149 lb (67.6 kg)  Height: 5\' 3"  (1.6 m)    Wt Readings from Last 3 Encounters:  09/27/17 149 lb (67.6 kg)  06/28/17 149 lb (67.6 kg)  06/11/17 149 lb (67.6 kg)     PHYSICAL EXAM General: NAD HEENT: Normal. Neck: No JVD, no thyromegaly. Lungs: Clear to auscultation bilaterally with normal respiratory effort. CV: Regular rate and rhythm (heart rate promptly increased with deep  inspiration and regulated with expiration), normal S1/S2, no S3/S4, no murmur. No pretibial or periankle edema.  No carotid bruit.   Abdomen: Soft, nontender, no distention.  Neurologic: Alert and oriented.  Psych: Normal affect. Skin: Normal. Musculoskeletal: No gross deformities.    ECG: Most recent ECG reviewed.   Labs: Lab Results  Component Value Date/Time   K 4.3 06/11/2017 04:34 PM   BUN 9 06/11/2017 04:34 PM   CREATININE 0.74 06/11/2017 04:34 PM   ALT 17 06/03/2015 03:16 PM   TSH 0.71 06/11/2017 04:34 PM   HGB 12.0 06/03/2015 03:16 PM   HGB 12.6 02/16/2013  05:04 PM     Lipids: No results found for: LDLCALC, LDLDIRECT, CHOL, TRIG, HDL     ASSESSMENT AND PLAN: 1.  Palpitations and chest pain: Echocardiogram and event monitoring were unremarkable.  She will likely has paroxysmal inappropriate sinus tachycardia but it is very transient.  I do not recommend daily or as needed medications at this time, as they would likely cause her to feel fatigued.  I will continue to monitor her symptoms.    Disposition: Follow up 6 months   Prentice Docker, M.D., F.A.C.C.

## 2018-01-27 ENCOUNTER — Telehealth: Payer: Self-pay | Admitting: *Deleted

## 2018-01-27 NOTE — Telephone Encounter (Signed)
Received call from patient.   Reports that she continues to have intermittent abd pain with N/V. Patient has been seen multiple times for issues.   Last OV 06/03/2015 "recurrent episodes of intermittent abdominal pain nausea and diarrhea. It typically happens every time she eats especially heavy meal spicy meal. She will have to run to the bathroom directly after and have a loose stool. She was evaluated back in January 2015 for this all of her labs were normal there is no evidence of gluten insensitivity or colitis. She had right upper quadrant ultrasound done which was negative. "  Patient requested referral to GI. MD please advise.

## 2018-01-27 NOTE — Telephone Encounter (Signed)
Her last couple of visits about palpiotations Nothing about GI in past 2 years, needs OV

## 2018-01-27 NOTE — Telephone Encounter (Signed)
Call placed to patient and patient made aware.   Patient will call back to schedule OV.

## 2018-02-25 ENCOUNTER — Other Ambulatory Visit: Payer: Self-pay

## 2018-02-25 ENCOUNTER — Encounter (HOSPITAL_COMMUNITY): Payer: Self-pay | Admitting: Emergency Medicine

## 2018-02-25 ENCOUNTER — Emergency Department (HOSPITAL_COMMUNITY): Payer: Managed Care, Other (non HMO)

## 2018-02-25 ENCOUNTER — Emergency Department (HOSPITAL_COMMUNITY)
Admission: EM | Admit: 2018-02-25 | Discharge: 2018-02-25 | Disposition: A | Discharge status: Home / Self Care | Payer: Managed Care, Other (non HMO) | Attending: Emergency Medicine | Admitting: Emergency Medicine

## 2018-02-25 DIAGNOSIS — S0990XA Unspecified injury of head, initial encounter: Secondary | ICD-10-CM | POA: Diagnosis present

## 2018-02-25 DIAGNOSIS — Y9389 Activity, other specified: Secondary | ICD-10-CM | POA: Insufficient documentation

## 2018-02-25 DIAGNOSIS — R51 Headache: Secondary | ICD-10-CM | POA: Insufficient documentation

## 2018-02-25 DIAGNOSIS — S0003XA Contusion of scalp, initial encounter: Secondary | ICD-10-CM | POA: Diagnosis not present

## 2018-02-25 DIAGNOSIS — Z79899 Other long term (current) drug therapy: Secondary | ICD-10-CM | POA: Insufficient documentation

## 2018-02-25 DIAGNOSIS — W500XXA Accidental hit or strike by another person, initial encounter: Secondary | ICD-10-CM | POA: Insufficient documentation

## 2018-02-25 DIAGNOSIS — Y998 Other external cause status: Secondary | ICD-10-CM | POA: Diagnosis not present

## 2018-02-25 DIAGNOSIS — Y929 Unspecified place or not applicable: Secondary | ICD-10-CM | POA: Insufficient documentation

## 2018-02-25 NOTE — Discharge Instructions (Addendum)
Tylenol for headaches.  Return if any problems.   
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
## 2018-04-11 ENCOUNTER — Other Ambulatory Visit: Payer: Self-pay

## 2018-04-11 ENCOUNTER — Ambulatory Visit: Payer: Managed Care, Other (non HMO) | Admitting: Family Medicine

## 2018-04-11 ENCOUNTER — Encounter: Payer: Self-pay | Admitting: Family Medicine

## 2018-04-11 VITALS — BP 102/70 | HR 70 | Temp 97.9°F | Resp 14 | Ht 63.0 in | Wt 144.0 lb

## 2018-04-11 DIAGNOSIS — K589 Irritable bowel syndrome without diarrhea: Secondary | ICD-10-CM | POA: Diagnosis not present

## 2018-04-11 DIAGNOSIS — R1084 Generalized abdominal pain: Secondary | ICD-10-CM

## 2018-04-11 NOTE — Patient Instructions (Signed)
Referral to GI - later afternoon appointment F/U as needed

## 2018-04-11 NOTE — Progress Notes (Signed)
    Subjective:    Patient ID: Anne Underwood, female    DOB: 04-27-1996, 22 y.o.   MRN: [id number]  Patient presents for Referral~ GI     Pt here with GI issues. She had had GI upset throughout the years. Has not correlated speciic foods with the pain. She gets waves of pain, and cramping that can come out of no where has Bowel movemet  Most of the time pain occurs after eating Bowel movements mostly soft  Or diarrheal stools  In between has normal bowel movements Gets gassy and bloated some times , gets nauseaous  Keeps pepto bismol Has had bright red blood in stool before, this has been intermittant  Has tried antacids, has tried proboptics    Mother has IBS/Constipation/GERD, Father has some GI issues      Review Of Systems:  GEN- denies fatigue, fever, weight loss,weakness, recent illness HEENT- denies eye drainage, change in vision, nasal discharge, CVS- denies chest pain, palpitations RESP- denies SOB, cough, wheeze ABD- denies N/V, change in stools, abd pain GU- denies dysuria, hematuria, dribbling, incontinence MSK- denies joint pain, muscle aches, injury Neuro- denies headache, dizziness, syncope, seizure activity       Objective:    BP 102/70   Pulse 70   Temp 97.9 F (36.6 C) (Oral)   Resp 14   Ht 5\' 3"  (1.6 m)   Wt 144 lb (65.3 kg)   SpO2 99%   BMI 25.51 kg/m  GEN- NAD, alert and oriented x3 HEENT- PERRL, EOMI, non injected sclera, pink conjunctiva, MMM, oropharynx clear Neck- Supple, no thyromegaly CVS- RRR, no murmur RESP-CTAB ABD-NABS,soft,NT,ND Pulses- Radial 2+        Assessment & Plan:      Problem List Items Addressed This Visit    None    Visit Diagnoses    Irritable bowel syndrome, unspecified type    -  Primary   Recurrent episodes of severe pain with diarrhea, has tried OTC meds, probiotics,reflux meds, avoiding certain foods, send to GI    Relevant Orders   Ambulatory referral to Gastroenterology   Generalized abdominal  pain       no current pain, labs in past have been normal   Relevant Orders   Ambulatory referral to Gastroenterology      Note: This dictation was prepared with Dragon dictation along with smaller phrase technology. Any transcriptional errors that result from this process are unintentional.

## 2018-04-13 ENCOUNTER — Encounter: Payer: Self-pay | Admitting: Family Medicine

## 2018-04-26 ENCOUNTER — Encounter: Payer: Self-pay | Admitting: Physician Assistant

## 2018-04-26 ENCOUNTER — Other Ambulatory Visit: Payer: Self-pay

## 2018-04-26 ENCOUNTER — Ambulatory Visit: Payer: Managed Care, Other (non HMO) | Admitting: Physician Assistant

## 2018-04-26 VITALS — BP 105/70 | HR 66 | Temp 98.6°F | Resp 18 | Ht 63.15 in | Wt 140.8 lb

## 2018-04-26 DIAGNOSIS — F411 Generalized anxiety disorder: Secondary | ICD-10-CM | POA: Diagnosis not present

## 2018-04-26 DIAGNOSIS — F419 Anxiety disorder, unspecified: Secondary | ICD-10-CM | POA: Diagnosis not present

## 2018-04-26 DIAGNOSIS — Z7689 Persons encountering health services in other specified circumstances: Secondary | ICD-10-CM

## 2018-04-26 DIAGNOSIS — F329 Major depressive disorder, single episode, unspecified: Secondary | ICD-10-CM

## 2018-04-26 MED ORDER — ESCITALOPRAM OXALATE 10 MG PO TABS: 5.0000 mg | ORAL_TABLET | Freq: Every day | ORAL | 0 refills | Status: DC

## 2018-04-26 NOTE — Patient Instructions (Addendum)
Contact [facility] to schedule appointment as your referral has been sent there. Contact info is below: Gastroenterologist in [place], [place]  Address: [address] [place], [place] [address]  Phone: [phone]  For depression and anxiety, I recommend we start a daily SSRI and weekly/monthly therapy.The SSRI I am prescribing is called lexapro. Please keep in mind that when you start an SSRI it can worsen your depression and anxiety symptoms. It can also increase risk of suicidal thoughts so if this occurs, please seek care immediately. Common side effects of SSRIs include, but are not limited to, GI upset, nausea, vomiting, insomnia, and drowsiness. Typically these side effects will resolve after 2 weeks. Please keep in mind that it can take up to 4-6 weeks for SSRIs to be fully effective.  Below is some information for local therapists. I recommend you reach out to one of these therapists before our next visit.Plan to return to clinic in 4 weeks for reevaluation of symptoms.   For therapy -- [facility] ([address] [place]alie [place] [facility]celyn Schmid [place]) - [phone] [facility] [employer]) - [phone] [facility] - [phone] [facility] - [phone] [facility] Irish - [phone] [facility]  - [phone] (do not file insurance)   If you have lab work done today you will be contacted with your lab results within the next 2 weeks.  If you have not heard from [place] then please contact us. The fastest way to get your results is to register for My Chart.   Living With Anxiety After being diagnosed with an anxiety disorder, you may be relieved to know why you have felt or behaved a certain way. It is natural to also feel overwhelmed about the treatment ahead and what it will mean for your life. With care and support, you can manage this condition  and recover from it. How to cope with anxiety Dealing with stress Stress is your body's reaction to life changes and events, both good and bad. Stress can last just a few hours or it can be ongoing. Stress can play a major role in anxiety, so it is important to learn both how to cope with stress and how to think about it differently. Talk with your health care provider or a counselor to learn more about stress reduction. He or she may suggest some stress reduction techniques, such as:  Music therapy. This can include creating or listening to music that you enjoy and that inspires you.  Mindfulness-based meditation. This involves being aware of your normal breaths, rather than trying to control your breathing. It can be done while sitting or walking.  Centering prayer. This is a kind of meditation that involves focusing on a word, phrase, or sacred image that is meaningful to you and that brings you peace.  Deep breathing. To do this, expand your stomach and inhale slowly through your nose. Hold your breath for 3-5 seconds. Then exhale slowly, allowing your stomach muscles to relax.  Self-talk. This is a skill where you identify thought patterns that lead to anxiety reactions and correct those thoughts.  Muscle relaxation. This involves tensing muscles then relaxing them.  Choose a stress reduction technique that fits your lifestyle and personality. Stress reduction techniques take time and practice. Set aside 5-15 minutes a day to do them. Therapists can offer training in these techniques. The training may be covered by some insurance plans. Other things you can do to manage  stress include:  Keeping a stress diary. This can help you learn what triggers your stress and ways to control your response.  Thinking about how you respond to certain situations. You may not be able to control everything, but you can control your reaction.  Making time for activities that help you relax, and not feeling  guilty about spending your time in this way.  Therapy combined with coping and stress-reduction skills provides the best chance for successful treatment. Medicines Medicines can help ease symptoms. Medicines for anxiety include:  Anti-anxiety drugs.  Antidepressants.  Beta-blockers.  Medicines may be used as the main treatment for anxiety disorder, along with therapy, or if other treatments are not working. Medicines should be prescribed by a health care provider. Relationships Relationships can play a big part in helping you recover. Try to spend more time connecting with trusted friends and family members. Consider going to couples counseling, taking family education classes, or going to family therapy. Therapy can help you and others better understand the condition. How to recognize changes in your condition Everyone has a different response to treatment for anxiety. Recovery from anxiety happens when symptoms decrease and stop interfering with your daily activities at home or work. This may mean that you will start to:  Have better concentration and focus.  Sleep better.  Be less irritable.  Have more energy.  Have improved memory.  It is important to recognize when your condition is getting worse. Contact your health care provider if your symptoms interfere with home or work and you do not feel like your condition is improving. Where to find help and support: You can get help and support from these sources:  Self-help groups.  Online and [employer] organizations.  A trusted spiritual leader.  Couples counseling.  Family education classes.  Family therapy.  Follow these instructions at home:  Eat a healthy diet that includes plenty of vegetables, fruits, whole grains, low-fat dairy products, and lean protein. Do not eat a lot of foods that are high in solid fats, added sugars, or salt.  Exercise. Most adults should do the following: ? Exercise for at least 150  minutes each week. The exercise should increase your heart rate and make you sweat (moderate-intensity exercise). ? Strengthening exercises at least twice a week.  Cut down on caffeine, tobacco, alcohol, and other potentially harmful substances.  Get the right amount and quality of sleep. Most adults need 7-9 hours of sleep each night.  Make choices that simplify your life.  Take over-the-counter and prescription medicines only as told by your health care provider.  Avoid caffeine, alcohol, and certain over-the-counter cold medicines. These may make you feel worse. Ask your pharmacist which medicines to avoid.  Keep all follow-up visits as told by your health care provider. This is important. Questions to ask your health care provider  Would I benefit from therapy?  How often should I follow up with a health care provider?  How long do I need to take medicine?  Are there any long-term side effects of my medicine?  Are there any alternatives to taking medicine? Contact a health care provider if:  You have a hard time staying focused or finishing daily tasks.  You spend many hours a day feeling worried about everyday life.  You become exhausted by worry.  You start to have headaches, feel tense, or have nausea.  You urinate more than normal.  You have diarrhea. Get help right away if:  You have a racing  heart and shortness of breath.  You have thoughts of hurting yourself or others. If you ever feel like you may hurt yourself or others, or have thoughts about taking your own life, get help right away. You can go to your nearest emergency department or call:  Your local emergency services (911 in the [place]  A suicide crisis helpline, such as the National Suicide Prevention Lifeline at 9052752720. This is open 24-hours a day.  Summary  Taking steps to deal with stress can help calm you.  Medicines cannot cure anxiety disorders, but they can help ease  symptoms.  Family, friends, and partners can play a big part in helping you recover from an anxiety disorder. This information is not intended to replace advice given to you by your health care provider. Make sure you discuss any questions you have with your health care provider. Document Released: 08/07/2016 Document Revised: 08/07/2016 Document Reviewed: 08/07/2016 Elsevier Interactive Patient Education  2018 [employer].  IF you received an x-ray today, you will receive an invoice from [facility]. Please contact [facility] at [phone] with questions or concerns regarding your invoice.   IF you received labwork today, you will receive an invoice from [facility]. Please contact [facility] at [phone] with questions or concerns regarding your invoice.   Our billing staff will not be able to assist you with questions regarding bills from these companies.  You will be contacted with the lab results as soon as they are available. The fastest way to get your results is to activate your My Chart account. Instructions are located on the last page of this paperwork. If you have not heard from [place] regarding the results in 2 weeks, please contact this office.

## 2018-04-26 NOTE — Progress Notes (Signed)
Lucas MallowHalie M Opdahl  MRN: [id number] DOB: November 27, 1995  Subjective:  Lucas MallowHalie M Blinn is a 22 y.o. female seen in office today for a chief complaint to establish care and anxiety.   Anxiety: Has struggled for this for about 5 years but has gotten very severe over the past month.  Used to think she could just control it by herself but notes it has gotten to a point that she cannot handle it. She has frequent episodes of crying and excessive worrying about every little thing. Has had two panic attacks in the past month.  Notes it is starting to interfere with her relationship with her boyfriend. She tried prozac years ago but notes it made her feel like a zombie. Has not tried anything since and has not tried counseling. Has some dysphoric mood but notes it does not interfere with her daily. Denies decreased interest in doing things, sleep disturbance, hallucinations, grandiose thoughts, impulsive behaviors, SI and HI. Drinks alcohol occasionally. Denies illicit drug use.No PMH of seizure disorder. No FH of schizophrenia or bipolar disorder.   Review of Systems Per HPI Patient Active Problem List   Diagnosis Date Noted  . Pelvic pain in female 10/14/2014  . Ovarian cyst, follicular 10/14/2014  . ADD (attention deficit disorder) 07/02/2014  . Headache 12/12/2013  . Panic attacks 11/03/2013  . Palpitations 11/03/2013    Current Outpatient Medications on File Prior to Visit  Medication Sig Dispense Refill  . ibuprofen (ADVIL,MOTRIN) 800 MG tablet Take 1 tablet (800 mg total) by mouth every 8 (eight) hours as needed for moderate pain. 40 tablet 1  . norethindrone-ethinyl estradiol (MICROGESTIN,JUNEL,LOESTRIN) 1-20 MG-MCG tablet TAKE (1) TABLET BY MOUTH ONCE DAILY. 63 tablet 0   No current facility-administered medications on file prior to visit.     No Known Allergies    Social History   Socioeconomic History  . Marital status: Single    Spouse name: Not on file  . Number of children: 0  .  Years of education: Not on file  . Highest education level: Not on file  Occupational History  . Not on file  Social Needs  . Financial resource strain: Not on file  . Food insecurity:    Worry: Not on file    Inability: Not on file  . Transportation needs:    Medical: Not on file    Non-medical: Not on file  Tobacco Use  . Smoking status: Never Smoker  . Smokeless tobacco: Never Used  Substance and Sexual Activity  . Alcohol use: Yes    Comment: occasionally  . Drug use: No  . Sexual activity: Yes    Birth control/protection: Pill  Lifestyle  . Physical activity:    Days per week: Not on file    Minutes per session: Not on file  . Stress: Not on file  Relationships  . Social connections:    Talks on phone: Not on file    Gets together: Not on file    Attends religious service: Not on file    Active member of club or organization: Not on file    Attends meetings of clubs or organizations: Not on file    Relationship status: Not on file  . Intimate partner violence:    Fear of current or ex partner: Not on file    Emotionally abused: Not on file    Physically abused: Not on file    Forced sexual activity: Not on file  Other Topics Concern  . Not  on file  Social History Narrative  . Not on file    Objective:  BP 105/70   Pulse 66   Temp 98.6 F (37 C) (Oral)   Resp 18   Ht 5' 3.15" (1.604 m)   Wt 140 lb 12.8 oz (63.9 kg)   SpO2 97%   BMI 24.82 kg/m   Physical Exam  Constitutional: She is oriented to person, place, and time. She appears well-developed and well-nourished.  HENT:  Head: Normocephalic and atraumatic.  Eyes: Conjunctivae are normal.  Neck: Normal range of motion.  Cardiovascular: Normal rate, regular rhythm and normal heart sounds.  Pulmonary/Chest: Effort normal.  Neurological: She is alert and oriented to person, place, and time.  Skin: Skin is warm and dry.  Psychiatric: Her mood appears anxious.  Tearful during HPI  Vitals reviewed.     GAD 7 : Generalized Anxiety Score 04/26/2018  Nervous, Anxious, on Edge 2  Control/stop worrying 3  Worry too much - different things 3  Trouble relaxing 2  Restless 2  Easily annoyed or irritable 3  Afraid - awful might happen 2  Total GAD 7 Score 17  Anxiety Difficulty Not difficult at all    Depression screen [facility] 2/9 04/26/2018 04/11/2018 06/11/2017 04/26/2017 12/11/2013  Decreased Interest 0 0 0 0 0  Down, Depressed, Hopeless 1 0 0 0 0  PHQ - 2 Score 1 0 0 0 0  Altered sleeping 1 - - 0 1  Tired, decreased energy 2 - - 1 2  Change in appetite 1 - - 0 1  Feeling bad or failure about yourself  1 - - 0 0  Trouble concentrating 2 - - 1 2  Moving slowly or fidgety/restless 0 - - 0 0  Suicidal thoughts 0 - - 0 0  PHQ-9 Score 8 - - 2 6  Difficult doing work/chores Not difficult at all - - Not difficult at all -     Assessment and Plan :  1. Generalized anxiety disorder Hx consistent with GAD. Rec both pharmacotherapy and counseling at this time. Start lexapro 5mg , increase to 10mg  after one week if tolerating well. Given contact info for local therapists. Plan to f/u in 4-6 weeks.  - escitalopram (LEXAPRO) 10 MG tablet; Take 0.5 tablets (5 mg total) by mouth daily.  Dispense: 90 tablet; Refill: 0 2. Encounter to establish care I am happy to take over pt's care.   Side effects, risks, benefits, and alternatives of the medications and treatment plan prescribed today were discussed, and patient expressed understanding of the instructions given. No barriers to understanding were identified. Red flags discussed in detail. Pt expressed understanding regarding what to do in case of emergency/urgent symptoms.  A total of 30 minutes was spent in the room with the patient, greater than 50% of which was in counseling/coordination of care regarding GAD.  Benjiman Core PA-C  Primary Care at [facility] 04/26/2018 10:02 AM

## 2018-04-29 ENCOUNTER — Encounter: Payer: Self-pay | Admitting: Gastroenterology

## 2018-05-14 ENCOUNTER — Other Ambulatory Visit: Payer: Self-pay

## 2018-05-14 ENCOUNTER — Encounter: Payer: Self-pay | Admitting: Nurse Practitioner

## 2018-05-14 ENCOUNTER — Ambulatory Visit: Payer: Managed Care, Other (non HMO) | Admitting: Nurse Practitioner

## 2018-05-14 DIAGNOSIS — K5900 Constipation, unspecified: Secondary | ICD-10-CM

## 2018-05-14 DIAGNOSIS — K625 Hemorrhage of anus and rectum: Secondary | ICD-10-CM

## 2018-05-14 DIAGNOSIS — R109 Unspecified abdominal pain: Secondary | ICD-10-CM | POA: Insufficient documentation

## 2018-05-14 DIAGNOSIS — R1030 Lower abdominal pain, unspecified: Secondary | ICD-10-CM

## 2018-05-14 MED ORDER — CLENPIQ 10-3.5-12 MG-GM -GM/160ML PO SOLN: 1.0000 | Freq: Once | ORAL | 0 refills | Status: AC

## 2018-05-14 NOTE — Assessment & Plan Note (Signed)
The patient notes intermittent rectal bleeding in the setting of abdominal pain and constipation.  Her last episode was a couple months ago.  When she does have bleeding she notes "it is a lot."  She is never had a colonoscopy before.  Likely benign anorectal source although I cannot rule out more insidious pathology.  However, it would be rare for her to have colon cancer at her age.  Regardless we will plan for colonoscopy to further evaluate and ensure benign anorectal source versus more insidious pathology.  Return for follow-up in 3 months.  Further treatment as per above.  Proceed with colonoscopy with Dr. Darrick PennaFields in the near future. The risks, benefits, and alternatives have been discussed in detail with the patient. They state understanding and desire to proceed.   Patient is currently on Lexapro.  No other anticoagulants, anxiolytics, chronic pain medications, or antidepressants.  She does have anxiety.  We will plan for the procedure with 12.5 mg of preprocedure Phenergan to promote adequate sedation.

## 2018-05-14 NOTE — Patient Instructions (Signed)
1. I am giving you samples of Linzess 72 mcg.  Take 1 pill, once a day, on an empty stomach. 2. Call us in 1 to 2 weeks and let us know if it is helping your symptoms. 3. We will schedule your colonoscopy for you. 4. Return for follow-up in 3 months. 5. Call us if you have any questions or concerns.  [facility] we value your feedback. You may receive a survey about your visit today. Please share your experience as we strive to create trusting relationships with our patients to provide genuine, compassionate, quality care.  We appreciate your understanding and patience as we review any laboratory studies, imaging, and other diagnostic tests that are ordered as we care for you. Our office policy is 5 business days for review of these results, and any emergent or urgent results are addressed in a timely manner for your best interest. If you do not hear from our office in 1 week, please contact us.   We also encourage the use of MyChart, which contains your medical information for your review as well. If you are not enrolled in this feature, an access code is on this after visit summary for your convenience. Thank you for allowing us to be involved in your care.  It was great to meet you today!  I hope you have a great Fall!!

## 2018-05-14 NOTE — Assessment & Plan Note (Signed)
The patient notes lower abdominal crampy pain associated with constipation and, rarely, diarrhea.  Her pain typically does improve after a bowel movement.  It does sometimes linger.  This is reminiscent of IBS either constipation type or partially mixed type.  At this point given that her symptoms are 80% plus constipation I will start her on Linzess to see if this helps.  She could be having overflow diarrhea related to constipation.  I will provide samples of Linzess and request a progress report.  Follow-up in 3 months.  Colonoscopy as per below.

## 2018-05-14 NOTE — Assessment & Plan Note (Signed)
The patient has a daily bowel movement.  She notes that about 80% of the time or more she is constipated.  Rarely she does have some diarrhea.  This is in the setting of abdominal pain which improves typically after a bowel movement.  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  There is a possibility of mixed type predominance with constipation.  I will start her on Linzess 72 mcg daily.  We will provide samples for 1 to 2 weeks and request a progress report in 1 to 2 weeks.  Further recommendations to follow.  Return for follow-up in 3 months.

## 2018-05-14 NOTE — Progress Notes (Addendum)
REVIEWED-NO ADDITIONAL RECOMMENDATIONS.  Primary Care Physician:  Magdalene River, PA-C Primary Gastroenterologist:  Dr. Darrick Penna  Chief Complaint  Patient presents with  . Abdominal Pain    "really bad cramping"  . Irritable Bowel Syndrome    constipation/diarrhea, nausea    HPI:   Anne Underwood is a 22 y.o. female who presents on referral from primary care for IBS and abdominal pain.  Reviewed information provided with the referral including office visit dated 04/11/2018.  At that time the patient presented for referral to GI.  She noted years long GI symptoms including abdominal pain which comes in waves and not correlated with any particular food.  Cramping comes out of nowhere when she needs to have a bowel movement.  Typically pain is postprandial, bowel movements mostly softer diarrhea.  Gets gassy and bloated as well as nausea.  Takes Pepto-Bismol.  Bright red blood in stools before but it is intermittent.  Has tried antacids and probiotics.  Mother has a history of IBS, constipation, GERD.  Overall deemed irritable bowel syndrome with recurrent episodes of severe pain and diarrhea has tried over-the-counter meds and probiotics, reflux meds, food avoidance.  Recommended referral to GI.  No history of colonoscopy or endoscopy in our system  Today she states she's doing ok overall. She has had symptoms since about 6 years ago. She was started on a "blue pill" (likely Bentyl). She got tired of taking it and wasn't seeing a change so she stopped taking it. About a month or two ago her symptoms became severe and so she sought treatment. Abdominal pain is typically lower abdomen, cramping, typically improves/resolves after a bowel movement but sometimes doesn't. She had constipation or diarrhea (watery). On average has a bowel movement 1-2 times daily. She feels like she is mostly constipated (typically 8/10 stools are constipated). When she's constipated stools are typically hard and  straining, usually has incomplete emptying. Associated with nausea when she is having severe symptoms, occasionally will have vomiting. Denies hematemesis. Has seen hematochezia, last occurrence was a couple months ago; doesn't happen often but when it does "it is a lot of blood." Has never had a colonoscopy. Denies melena, fever, chills, unintentional weight loss although she feels like her weight fluctuates. Did have SVT diagnosed by Holter Monitor. States she passes out easily. Denies chest pain, dyspnea, syncope, near syncope. Denies any other upper or lower GI symptoms.  Past Medical History:  Diagnosis Date  . ADD (attention deficit disorder)   . Anxiety   . Family history of adverse reaction to anesthesia    MOTHER HAD PONV  . Ovarian cyst, follicular 10/14/2014  . Pelvic pain in female 10/14/2014    Past Surgical History:  Procedure Laterality Date  . LAPAROSCOPY N/A 10/15/2014   Procedure: LAPAROSCOPY OPERATIVE;  Surgeon: Sherian Rein, MD;  Location: [facility];  Service: Gynecology;  Laterality: N/A;  . OVARIAN CYST REMOVAL Right 10/15/2014   Procedure: OVARIAN CYSTECTOMY;  Surgeon: Sherian Rein, MD;  Location: [facility];  Service: Gynecology;  Laterality: Right;  Dr only needs 1hr OR time  . WISDOM TOOTH EXTRACTION      Current Outpatient Medications  Medication Sig Dispense Refill  . escitalopram (LEXAPRO) 10 MG tablet Take 0.5 tablets (5 mg total) by mouth daily. 90 tablet 0  . norethindrone-ethinyl estradiol (MICROGESTIN,JUNEL,LOESTRIN) 1-20 MG-MCG tablet TAKE (1) TABLET BY MOUTH ONCE DAILY. 63 tablet 0   No current facility-administered medications for this visit.     Allergies as of 05/14/2018  . (  No Known Allergies)    Family History  Problem Relation Age of Onset  . Healthy Mother   . Cancer Mother [id number]       Breast Cancer  . Healthy Father   . COPD Maternal Grandfather   . Heart attack Paternal Grandfather   . Colon cancer Neg Hx     Social History    Socioeconomic History  . Marital status: Single    Spouse name: Not on file  . Number of children: 0  . Years of education: Not on file  . Highest education level: Not on file  Occupational History  . Not on file  Social Needs  . Financial resource strain: Not on file  . Food insecurity:    Worry: Not on file    Inability: Not on file  . Transportation needs:    Medical: Not on file    Non-medical: Not on file  Tobacco Use  . Smoking status: Never Smoker  . Smokeless tobacco: Never Used  Substance and Sexual Activity  . Alcohol use: Yes    Comment: occasionally  . Drug use: No  . Sexual activity: Yes    Birth control/protection: Pill  Lifestyle  . Physical activity:    Days per week: Not on file    Minutes per session: Not on file  . Stress: Not on file  Relationships  . Social connections:    Talks on phone: Not on file    Gets together: Not on file    Attends religious service: Not on file    Active member of club or organization: Not on file    Attends meetings of clubs or organizations: Not on file    Relationship status: Not on file  . Intimate partner violence:    Fear of current or ex partner: Not on file    Emotionally abused: Not on file    Physically abused: Not on file    Forced sexual activity: Not on file  Other Topics Concern  . Not on file  Social History Narrative  . Not on file    Review of Systems: Complete ROS negative except as per HPI.    Physical Exam: BP 100/70   Pulse 83   Temp 98.8 F (37.1 C) (Oral)   Ht 5\' 3"  (1.6 m)   Wt 140 lb 6.4 oz (63.7 kg)   LMP 04/07/2018   BMI 24.87 kg/m  General:   Alert and oriented. Pleasant and cooperative. Well-nourished and well-developed.  Head:  Normocephalic and atraumatic. Eyes:  Without icterus, sclera clear and conjunctiva pink.  Ears:  Normal auditory acuity. Cardiovascular:  S1, S2 present without murmurs appreciated. Extremities without clubbing or edema. Respiratory:  Clear to  auscultation bilaterally. No wheezes, rales, or rhonchi. No distress.  Gastrointestinal:  +BS, soft, non-tender and non-distended. No HSM noted. No guarding or rebound. No masses appreciated.  Rectal:  Deferred  Musculoskalatal:  Symmetrical without gross deformities. Neurologic:  Alert and oriented x4;  grossly normal neurologically. Psych:  Alert and cooperative. Normal mood and affect. Heme/Lymph/Immune: No excessive bruising noted.    05/14/2018 3:16 PM   Disclaimer: This note was dictated with voice recognition software. Similar sounding words can inadvertently be transcribed and may not be corrected upon review.

## 2018-05-15 NOTE — Progress Notes (Signed)
CC'D TO PCP °

## 2018-06-03 ENCOUNTER — Telehealth: Payer: Self-pay | Admitting: Gastroenterology

## 2018-06-03 NOTE — Telephone Encounter (Signed)
Pt is taking Linzess 72 mcg once qam before breakfast. Pt continues to have diarrhea all day with some occassionally cramping of the stomach. Pt says her body is sensitive to medicines so she thinks that's why she can't tolerate it.

## 2018-06-03 NOTE — Telephone Encounter (Signed)
Pt was seen by EG recently and was trying Linzess samples. She said it is keeping her stomach tore up and she hates it. Is there anything else we can recommend for her? Please advise. [phone]

## 2018-06-04 NOTE — Telephone Encounter (Signed)
Noted. Spoke with pt and samples are ready for pickup.

## 2018-06-04 NOTE — Telephone Encounter (Signed)
Stop Linzess. She can pick up samples of Amitiza 8 mcg, twice daily on a FULL STOMACH.  Call with progress report in 1 week.

## 2018-06-24 ENCOUNTER — Telehealth: Payer: Self-pay | Admitting: Physician Assistant

## 2018-06-24 NOTE — Telephone Encounter (Signed)
Please advise 

## 2018-06-24 NOTE — Telephone Encounter (Signed)
Copied from CRM [id number]. Topic: General - Other >> Jun 24, 2018  9:58 AM Gaynelle Adu wrote: Reason for CRM: PT has cancel her follow up appt with Benjiman Core on 11-14, she will be going out of town and she is hoping to have a verbal conversation over the phone  instead due her work schedule she is requesting a call back. Please advise

## 2018-06-25 NOTE — Telephone Encounter (Signed)
Called pt, no answer. Voicemail is full. Can we please try to call her again? She was supposed to follow up in 4-6 weeks after starting medication. She has not followed up yet. She will need to do so for further refills. I have openings before 11/14 if she is going to be out of town then. Please let her know my last day is 11/22 so definitely should make appointment before then.   If we cannot get a hold of her, please send a letter with details above.

## 2018-06-26 NOTE — Telephone Encounter (Signed)
Sent letter  -  Micah Flesher via MyChart - with Brittany's message

## 2018-06-26 NOTE — Telephone Encounter (Signed)
Attempted to call pt.  VM full - cannot leave message

## 2018-06-30 ENCOUNTER — Ambulatory Visit: Payer: Managed Care, Other (non HMO) | Admitting: Physician Assistant

## 2018-07-04 ENCOUNTER — Ambulatory Visit (HOSPITAL_COMMUNITY)
Admission: RE | Admit: 2018-07-04 | Discharge: 2018-07-04 | Disposition: A | Discharge status: Home / Self Care | Payer: Managed Care, Other (non HMO) | Source: Ambulatory Visit | Attending: Gastroenterology | Admitting: Gastroenterology

## 2018-07-04 ENCOUNTER — Encounter (HOSPITAL_COMMUNITY)
Admission: RE | Disposition: A | Discharge status: Home / Self Care | Payer: Self-pay | Source: Ambulatory Visit | Attending: Gastroenterology

## 2018-07-04 ENCOUNTER — Encounter (HOSPITAL_COMMUNITY): Payer: Self-pay | Admitting: *Deleted

## 2018-07-04 ENCOUNTER — Ambulatory Visit: Payer: Managed Care, Other (non HMO) | Admitting: Physician Assistant

## 2018-07-04 ENCOUNTER — Other Ambulatory Visit: Payer: Self-pay

## 2018-07-04 DIAGNOSIS — R1030 Lower abdominal pain, unspecified: Secondary | ICD-10-CM

## 2018-07-04 DIAGNOSIS — Q439 Congenital malformation of intestine, unspecified: Secondary | ICD-10-CM | POA: Insufficient documentation

## 2018-07-04 DIAGNOSIS — K5900 Constipation, unspecified: Secondary | ICD-10-CM | POA: Diagnosis not present

## 2018-07-04 DIAGNOSIS — Z793 Long term (current) use of hormonal contraceptives: Secondary | ICD-10-CM | POA: Diagnosis not present

## 2018-07-04 DIAGNOSIS — K648 Other hemorrhoids: Secondary | ICD-10-CM | POA: Diagnosis not present

## 2018-07-04 DIAGNOSIS — K449 Diaphragmatic hernia without obstruction or gangrene: Secondary | ICD-10-CM

## 2018-07-04 DIAGNOSIS — F419 Anxiety disorder, unspecified: Secondary | ICD-10-CM | POA: Insufficient documentation

## 2018-07-04 DIAGNOSIS — K625 Hemorrhage of anus and rectum: Secondary | ICD-10-CM

## 2018-07-04 DIAGNOSIS — K921 Melena: Secondary | ICD-10-CM | POA: Diagnosis not present

## 2018-07-04 DIAGNOSIS — R197 Diarrhea, unspecified: Secondary | ICD-10-CM | POA: Diagnosis not present

## 2018-07-04 HISTORY — PX: COLONOSCOPY: SHX5424

## 2018-07-04 SURGERY — COLONOSCOPY
Anesthesia: Moderate Sedation

## 2018-07-04 MED ORDER — SODIUM CHLORIDE 0.9 % IV SOLN
INTRAVENOUS | Status: DC
  Administered 2018-07-04: 13:00:00 via INTRAVENOUS

## 2018-07-04 MED ORDER — MEPERIDINE HCL 100 MG/ML IJ SOLN
INTRAMUSCULAR | Status: AC
  Filled 2018-07-04: qty 2

## 2018-07-04 MED ORDER — SODIUM CHLORIDE 0.9% FLUSH
INTRAVENOUS | Status: AC
  Filled 2018-07-04: qty 10

## 2018-07-04 MED ORDER — MEPERIDINE HCL 100 MG/ML IJ SOLN
INTRAMUSCULAR | Status: DC | PRN
  Administered 2018-07-04 (×4): 25 mg

## 2018-07-04 MED ORDER — PROMETHAZINE HCL 25 MG/ML IJ SOLN
INTRAMUSCULAR | Status: DC | PRN
  Administered 2018-07-04: 12.5 mg via INTRAVENOUS

## 2018-07-04 MED ORDER — PROMETHAZINE HCL 25 MG/ML IJ SOLN
12.5000 mg | Freq: Once | INTRAMUSCULAR | Status: AC
  Administered 2018-07-04: 12.5 mg via INTRAVENOUS

## 2018-07-04 MED ORDER — MIDAZOLAM HCL 5 MG/5ML IJ SOLN
INTRAMUSCULAR | Status: DC | PRN
  Administered 2018-07-04: 2 mg via INTRAVENOUS
  Administered 2018-07-04: 1 mg via INTRAVENOUS
  Administered 2018-07-04: 2 mg via INTRAVENOUS

## 2018-07-04 MED ORDER — PROMETHAZINE HCL 25 MG/ML IJ SOLN
INTRAMUSCULAR | Status: AC
  Filled 2018-07-04: qty 1

## 2018-07-04 MED ORDER — MIDAZOLAM HCL 5 MG/5ML IJ SOLN
INTRAMUSCULAR | Status: AC
  Filled 2018-07-04: qty 10

## 2018-07-04 NOTE — Op Note (Signed)
[facility] Patient Name: Anne Underwood Procedure Date: 07/04/2018 12:31 PM MRN: [id number] Date of Birth: 1996-03-18 Attending MD: Jonette Eva MD, MD CSN: [id number] Age: 22 Admit Type: Outpatient Procedure:                Colonoscopy WITH COLD FORCEPS BIOPSY Indications:              Hematochezia, Constipation, Diarrhea Providers:                Jonette Eva MD, MD, Jannett Celestine, RN, Edrick Kins,                            RN Referring MD:             Meda Coffee. [place] Medicines:                Promethazine 25 mg IV, Meperidine 100 mg IV,                            Midazolam 5 mg IV Complications:            No immediate complications. Estimated Blood Loss:     Estimated blood loss was minimal. Procedure:                After obtaining informed consent, the colonoscope                            was passed under direct vision. Throughout the                            procedure, the patient's blood pressure, pulse, and                            oxygen saturations were monitored continuously. The                            CF-HQ190L ([id number]) scope was introduced through                            the anus and advanced to the the cecum, identified                            by appendiceal orifice and ileocecal valve. The                            colonoscopy was performed with difficulty due to a                            tortuous colon. Successful completion of the                            procedure was aided by increasing the dose of                            sedation medication, withdrawing the scope and  replacing with the pediatric colonoscope,                            straightening and shortening the scope to obtain                            bowel loop reduction and COLOWRAP. The patient                            tolerated the procedure fairly well. The quality of                            the bowel preparation was excellent. The terminal                             ileum, ileocecal valve, appendiceal orifice, and                            rectum were photographed. Scope In: 2:11:15 PM Scope Out: 2:26:37 PM Scope Withdrawal Time: 0 hours 12 minutes 51 seconds  Total Procedure Duration: 0 hours 15 minutes 22 seconds  Findings:      The terminal ileum appeared normal.      The recto-sigmoid colon, sigmoid colon and descending colon were grossly       tortuous. Biopsies for histology were taken with a cold forceps from the       entire colon for evaluation of microscopic colitis.      The exam was otherwise without abnormality.      Internal hemorrhoids were found. The hemorrhoids were small. Impression:               - The examined portion of the ileum was normal.                           - Tortuous colon. Biopsied.                           - The examination was otherwise normal.                           - Internal hemorrhoids. Moderate Sedation:      Moderate (conscious) sedation was administered by the endoscopy nurse       and supervised by the endoscopist. The following parameters were       monitored: oxygen saturation, heart rate, blood pressure, and response       to care. Total physician intraservice time was 37 minutes. Recommendation:           - Patient has a contact number available for                            emergencies. The signs and symptoms of potential                            delayed complications were discussed with the  patient. Return to normal activities tomorrow.                            Written discharge instructions were provided to the                            patient.                           - High fiber diet.                           - Continue present medications.                           - Await pathology results.                           - Return to my office in 4 months. Procedure Code(s):        --- Professional ---                            [phone], Colonoscopy, flexible; with biopsy, single                            or multiple                           99153, Moderate sedation; each additional 15                            minutes intraservice time                           G0500, Moderate sedation services provided by the                            same physician or other qualified health care                            professional performing a gastrointestinal                            endoscopic service that sedation supports,                            requiring the presence of an independent trained                            observer to assist in the monitoring of the                            patient's level of consciousness and physiological                            status; initial 15 minutes of intra-service time;  patient age 45 years or older (additional time may                            be reported with 10258, as appropriate) Diagnosis Code(s):        --- Professional ---                           K64.8, Other hemorrhoids                           K92.1, Melena (includes Hematochezia)                           K59.00, Constipation, unspecified                           R19.7, Diarrhea, unspecified                           Q43.8, Other specified congenital malformations of                            intestine CPT copyright 2018 [employer] Medical [employer]. All rights reserved. The codes documented in this report are preliminary and upon coder review may  be revised to meet current compliance requirements. Jonette Eva, MD Jonette Eva MD, MD 07/04/2018 2:42:33 PM This report has been signed electronically. Number of Addenda: 0

## 2018-07-04 NOTE — H&P (Addendum)
Primary Care Physician:  Myles Lipps, MD Primary Gastroenterologist:  Dr. Darrick Penna  Pre-Procedure History & Physical: HPI:  Anne Underwood is a 22 y.o. female here for  BRBPR, PMHx: IBS-mixed.   Past Medical History:  Diagnosis Date  . ADD (attention deficit disorder)   . Anxiety   . Family history of adverse reaction to anesthesia    MOTHER HAD PONV  . Ovarian cyst, follicular 10/14/2014  . Pelvic pain in female 10/14/2014    Past Surgical History:  Procedure Laterality Date  . LAPAROSCOPY N/A 10/15/2014   Procedure: LAPAROSCOPY OPERATIVE;  Surgeon: Sherian Rein, MD;  Location: [facility];  Service: Gynecology;  Laterality: N/A;  . OVARIAN CYST REMOVAL Right 10/15/2014   Procedure: OVARIAN CYSTECTOMY;  Surgeon: Sherian Rein, MD;  Location: [facility];  Service: Gynecology;  Laterality: Right;  Dr only needs 1hr OR time  . WISDOM TOOTH EXTRACTION      Prior to Admission medications   Medication Sig Start Date End Date Taking? Authorizing Provider  bismuth subsalicylate (PEPTO BISMOL) 262 MG chewable tablet Chew 524 mg by mouth as needed for indigestion.   Yes [provider]  escitalopram (LEXAPRO) 10 MG tablet Take 0.5 tablets (5 mg total) by mouth daily. Patient taking differently: Take 5 mg by mouth at bedtime.  04/26/18  Yes Barnett Abu, [place] D, PA-C  ibuprofen (ADVIL,MOTRIN) 200 MG tablet Take 200-400 mg by mouth daily as needed for headache or moderate pain.   Yes [provider]  naproxen sodium (ALEVE) 220 MG tablet Take 220 mg by mouth daily as needed (pain).   Yes [provider]  norethindrone-ethinyl estradiol (MICROGESTIN,JUNEL,LOESTRIN) 1-20 MG-MCG tablet TAKE (1) TABLET BY MOUTH ONCE DAILY. Patient taking differently: Take 1 tablet by mouth at bedtime.  08/19/17  Yes Salley Scarlet, MD    Allergies as of 05/14/2018  . (No Known Allergies)    Family History  Problem Relation Age of Onset  . Healthy Mother   . Cancer Mother  81       Breast Cancer  . Healthy Father   . COPD Maternal Grandfather   . Heart attack Paternal Grandfather   . Colon cancer Neg Hx   . Colon polyps Neg Hx     Social History   Socioeconomic History  . Marital status: Single    Spouse name: Not on file  . Number of children: 0  . Years of education: Not on file  . Highest education level: Not on file  Occupational History  . Not on file  Social Needs  . Financial resource strain: Not on file  . Food insecurity:    Worry: Not on file    Inability: Not on file  . Transportation needs:    Medical: Not on file    Non-medical: Not on file  Tobacco Use  . Smoking status: Never Smoker  . Smokeless tobacco: Never Used  Substance and Sexual Activity  . Alcohol use: Yes    Comment: occasionally  . Drug use: No  . Sexual activity: Yes    Birth control/protection: Pill  Lifestyle  . Physical activity:    Days per week: Not on file    Minutes per session: Not on file  . Stress: Not on file  Relationships  . Social connections:    Talks on phone: Not on file    Gets together: Not on file    Attends religious service: Not on file    Active member of club or  organization: Not on file    Attends meetings of clubs or organizations: Not on file    Relationship status: Not on file  . Intimate partner violence:    Fear of current or ex partner: Not on file    Emotionally abused: Not on file    Physically abused: Not on file    Forced sexual activity: Not on file  Other Topics Concern  . Not on file  Social History Narrative  . Not on file    Review of Systems: See HPI, otherwise negative ROS   Physical Exam: BP 103/69   Pulse 72   Temp 98.2 F (36.8 C) (Oral)   Resp 17   Ht 5\' 3"  (1.6 m)   Wt 63.5 kg   LMP 06/22/2018   SpO2 (!) 17%   BMI 24.80 kg/m  General:   Alert,  pleasant and cooperative in NAD Head:  Normocephalic and atraumatic. Neck:  Supple; Lungs:  Clear throughout to auscultation.    Heart:   Regular rate and rhythm. Abdomen:  Soft, nontender and nondistended. Normal bowel sounds, without guarding, and without rebound.   Neurologic:  Alert and  oriented x4;  grossly normal neurologically.  Impression/Plan:    BRBPR  PLAN: TCS TODAY DISCUSSED PROCEDURE, BENEFITS, & RISKS: < 1% chance of medication reaction, bleeding, perforation, or rupture of spleen/liver.

## 2018-07-04 NOTE — Discharge Instructions (Signed)
You have SMALL EXTERNAL hemorrhoids. YOU DID NOT HAVE ANY POLYPS.   DRINK WATER TO KEEP YOUR URINE LIGHT YELLOW.  FOLLOW A LOW FODMAP DIET. AVOID ITEMS THAT CAUSE BLOATING. SEE HANDOUT.  USE PREPARATION H FOUR TIMES  A DAY IF NEEDED TO RELIEVE RECTAL PAIN/PRESSURE/BLEEDING.  FOLLOW UP IN 4 MOS.  Colonoscopy Care After Read the instructions outlined below and refer to this sheet in the next week. These discharge instructions provide you with general information on caring for yourself after you leave the hospital. While your treatment has been planned according to the most current medical practices available, unavoidable complications occasionally occur. If you have any problems or questions after discharge, call DR. Hilliary Jock, [phone].  ACTIVITY  You may resume your regular activity, but move at a slower pace for the next 24 hours.   Take frequent rest periods for the next 24 hours.   Walking will help get rid of the air and reduce the bloated feeling in your belly (abdomen).   No driving for 24 hours (because of the medicine (anesthesia) used during the test).   You may shower.   Do not sign any important legal documents or operate any machinery for 24 hours (because of the anesthesia used during the test).    NUTRITION  Drink plenty of fluids.   You may resume your normal diet as instructed by your doctor.   Begin with a light meal and progress to your normal diet. Heavy or fried foods are harder to digest and may make you feel sick to your stomach (nauseated).   Avoid alcoholic beverages for 24 hours or as instructed.    MEDICATIONS  You may resume your normal medications.   WHAT YOU CAN EXPECT TODAY  Some feelings of bloating in the abdomen.   Passage of more gas than usual.   Spotting of blood in your stool or on the toilet paper  .  IF YOU HAD POLYPS REMOVED DURING THE COLONOSCOPY:  Eat a soft diet IF YOU HAVE NAUSEA, BLOATING, ABDOMINAL PAIN, OR  VOMITING.    FINDING OUT THE RESULTS OF YOUR TEST Not all test results are available during your visit. DR. Darrick Penna WILL CALL YOU WITHIN 14 DAYS OF YOUR PROCEDUE WITH YOUR RESULTS. Do not assume everything is normal if you have not heard from DR. Sankalp Ferrell, CALL HER OFFICE AT [phone].  SEEK IMMEDIATE MEDICAL ATTENTION AND CALL THE OFFICE: [phone] IF:  You have more than a spotting of blood in your stool.   Your belly is swollen (abdominal distention).   You are nauseated or vomiting.   You have a temperature over 101F.   You have abdominal pain or discomfort that is severe or gets worse throughout the day.  High-Fiber Diet A high-fiber diet changes your normal diet to include more whole grains, legumes, fruits, and vegetables. Changes in the diet involve replacing refined carbohydrates with unrefined foods. The calorie level of the diet is essentially unchanged. The Dietary Reference Intake (recommended amount) for adult males is 38 grams per day. For adult females, it is 25 grams per day. Pregnant and lactating women should consume 28 grams of fiber per day. Fiber is the intact part of a plant that is not broken down during digestion. Functional fiber is fiber that has been isolated from the plant to provide a beneficial effect in the body. PURPOSE  Increase stool bulk.   Ease and regulate bowel movements.   Lower cholesterol.   REDUCE RISK OF COLON CANCER  INDICATIONS THAT YOU NEED MORE FIBER  Constipation and hemorrhoids.   Uncomplicated diverticulosis (intestine condition) and irritable bowel syndrome.   Weight management.   As a protective measure against hardening of the arteries (atherosclerosis), diabetes, and cancer.   GUIDELINES FOR INCREASING FIBER IN THE DIET  Start adding fiber to the diet slowly. A gradual increase of about 5 more grams (2 slices of whole-wheat bread, 2 servings of most fruits or vegetables, or 1 bowl of high-fiber cereal) per day is  best. Too rapid an increase in fiber may result in constipation, flatulence, and bloating.   Drink enough water and fluids to keep your urine clear or pale yellow. Water, juice, or caffeine-free drinks are recommended. Not drinking enough fluid may cause constipation.   Eat a variety of high-fiber foods rather than one type of fiber.   Try to increase your intake of fiber through using high-fiber foods rather than fiber pills or supplements that contain small amounts of fiber.   The goal is to change the types of food eaten. Do not supplement your present diet with high-fiber foods, but replace foods in your present diet.    INCLUDE A VARIETY OF FIBER SOURCES  Replace refined and processed grains with whole grains, canned fruits with fresh fruits, and incorporate other fiber sources. White rice, white breads, and most bakery goods contain little or no fiber.   Brown whole-grain rice, buckwheat oats, and many fruits and vegetables are all good sources of fiber. These include: broccoli, Brussels sprouts, cabbage, cauliflower, beets, sweet potatoes, white potatoes (skin on), carrots, tomatoes, eggplant, squash, berries, fresh fruits, and dried fruits.   Cereals appear to be the richest source of fiber. Cereal fiber is found in whole grains and bran. Bran is the fiber-rich outer coat of cereal grain, which is largely removed in refining. In whole-grain cereals, the bran remains. In breakfast cereals, the largest amount of fiber is found in those with "bran" in their names. The fiber content is sometimes indicated on the label.   You may need to include additional fruits and vegetables each day.   In baking, for 1 cup white flour, you may use the following substitutions:   1 cup whole-wheat flour minus 2 tablespoons.   1/2 cup white flour plus 1/2 cup whole-wheat flour.

## 2018-07-10 ENCOUNTER — Encounter (HOSPITAL_COMMUNITY): Payer: Self-pay | Admitting: Gastroenterology

## 2018-07-10 NOTE — Progress Notes (Signed)
PT is aware and Fodmap diet mailed to her.

## 2018-07-12 ENCOUNTER — Encounter: Payer: Self-pay | Admitting: Family Medicine

## 2018-07-12 ENCOUNTER — Ambulatory Visit (INDEPENDENT_AMBULATORY_CARE_PROVIDER_SITE_OTHER): Payer: Managed Care, Other (non HMO) | Admitting: Family Medicine

## 2018-07-12 ENCOUNTER — Other Ambulatory Visit: Payer: Self-pay

## 2018-07-12 DIAGNOSIS — F411 Generalized anxiety disorder: Secondary | ICD-10-CM

## 2018-07-12 MED ORDER — ESCITALOPRAM OXALATE 10 MG PO TABS: 5.0000 mg | ORAL_TABLET | Freq: Every day | ORAL | 1 refills | Status: DC

## 2018-07-12 NOTE — Progress Notes (Signed)
11/16/201910:19 AM  Anne Underwood DEALVA LAFOY 08-Jan-1996, 22 y.o. female [id number]  Chief Complaint  Patient presents with  . Follow-up    on lexapro medication, love it and has been helping a lot    HPI:   Patient is a 22 y.o. female with past medical history significant for GAD and IBS who presents today for followup  Last OV aug 2019 Started on lexapro 5mg , takes at bedtime Patient is really happy with benefits Prior to med cried and over thought everything Now having more appropriate emotions, normal range Sleep and appetite are doing well Sleep talking a bit more No sexual side effects Not doing any counseling  Fall Risk  07/12/2018 04/26/2018 04/11/2018 06/11/2017 04/26/2017  Falls in the past year? 0 No No No No     Depression screen [facility] 2/9 07/12/2018 04/26/2018 04/11/2018  Decreased Interest 0 0 0  Down, Depressed, Hopeless 0 1 0  PHQ - 2 Score 0 1 0  Altered sleeping - 1 -  Tired, decreased energy - 2 -  Change in appetite - 1 -  Feeling bad or failure about yourself  - 1 -  Trouble concentrating - 2 -  Moving slowly or fidgety/restless - 0 -  Suicidal thoughts - 0 -  PHQ-9 Score - 8 -  Difficult doing work/chores - Not difficult at all -   GAD 7 : Generalized Anxiety Score 07/12/2018 04/26/2018  Nervous, Anxious, on Edge 1 2  Control/stop worrying 1 3  Worry too much - different things 1 3  Trouble relaxing 1 2  Restless 1 2  Easily annoyed or irritable 1 3  Afraid - awful might happen 1 2  Total GAD 7 Score 7 17  Anxiety Difficulty Not difficult at all Not difficult at all     No Known Allergies  Prior to Admission medications   Medication Sig Start Date End Date Taking? Authorizing Provider  bismuth subsalicylate (PEPTO BISMOL) 262 MG chewable tablet Chew 524 mg by mouth as needed for indigestion.   Yes [provider]  escitalopram (LEXAPRO) 10 MG tablet Take 0.5 tablets (5 mg total) by mouth daily. Patient taking differently: Take 5 mg by mouth  at bedtime.  04/26/18  Yes Barnett Abu, [place] D, PA-C  ibuprofen (ADVIL,MOTRIN) 200 MG tablet Take 200-400 mg by mouth daily as needed for headache or moderate pain.   Yes [provider]  naproxen sodium (ALEVE) 220 MG tablet Take 220 mg by mouth daily as needed (pain).   Yes [provider]  norethindrone-ethinyl estradiol (MICROGESTIN,JUNEL,LOESTRIN) 1-20 MG-MCG tablet TAKE (1) TABLET BY MOUTH ONCE DAILY. Patient taking differently: Take 1 tablet by mouth at bedtime.  08/19/17  Yes [place], Velna Hatchet, MD    Past Medical History:  Diagnosis Date  . ADD (attention deficit disorder)   . Anxiety   . Family history of adverse reaction to anesthesia    MOTHER HAD PONV  . Ovarian cyst, follicular 10/14/2014  . Pelvic pain in female 10/14/2014    Past Surgical History:  Procedure Laterality Date  . COLONOSCOPY N/A 07/04/2018   Procedure: COLONOSCOPY;  Surgeon: West Bali, MD;  [facility];  Service: Endoscopy;  Laterality: N/A;  2:00pm  . LAPAROSCOPY N/A 10/15/2014   Procedure: LAPAROSCOPY OPERATIVE;  Surgeon: Sherian Rein, MD;  Location: [facility];  Service: Gynecology;  Laterality: N/A;  . OVARIAN CYST REMOVAL Right 10/15/2014   Procedure: OVARIAN CYSTECTOMY;  Surgeon: Sherian Rein, MD;  Location: [facility];  Service:  Gynecology;  Laterality: Right;  Dr only needs 1hr OR time  . WISDOM TOOTH EXTRACTION      Social History   Tobacco Use  . Smoking status: Never Smoker  . Smokeless tobacco: Never Used  Substance Use Topics  . Alcohol use: Yes    Comment: occasionally    Family History  Problem Relation Age of Onset  . Healthy Mother   . Cancer Mother 5450       Breast Cancer  . Healthy Father   . COPD Maternal Grandfather   . Heart attack Paternal Grandfather   . Colon cancer Neg Hx   . Colon polyps Neg Hx     ROS Per hpi  OBJECTIVE:  Blood pressure 107/71, pulse 65, temperature 98.5 F (36.9 C), temperature source Oral, resp. rate  12, height 5\' 3"  (1.6 m), weight 142 lb 12.8 oz (64.8 kg), last menstrual period 06/22/2018, SpO2 97 %. Body mass index is 25.3 kg/m.   Physical Exam  Constitutional: She is oriented to person, place, and time. She appears well-developed and well-nourished.  HENT:  Head: Normocephalic and atraumatic.  Mouth/Throat: Mucous membranes are normal.  Eyes: Pupils are equal, round, and reactive to light. Conjunctivae and EOM are normal. No scleral icterus.  Neck: Neck supple.  Pulmonary/Chest: Effort normal.  Neurological: She is alert and oriented to person, place, and time.  Skin: Skin is warm and dry.  Psychiatric: She has a normal mood and affect.  Nursing note and vitals reviewed.    ASSESSMENT and PLAN  1. Generalized anxiety disorder Controlled. Continue current regime.  - escitalopram (LEXAPRO) 10 MG tablet; Take 0.5 tablets (5 mg total) by mouth at bedtime.  Return in about 6 months (around 01/10/2019).    Myles LippsIrma M Santiago, MD [facility] [address] [place], [place] [address] Ph.  [phone] Fax [phone]

## 2018-07-12 NOTE — Patient Instructions (Signed)
° ° ° °  If you have lab work done today you will be contacted with your lab results within the next 2 weeks.  If you have not heard from us then please contact us. The fastest way to get your results is to register for My Chart. ° ° °IF you received an x-ray today, you will receive an invoice from [facility]. Please contact [facility] at [phone] with questions or concerns regarding your invoice.  ° °IF you received labwork today, you will receive an invoice from [facility]. Please contact [facility] at [phone] with questions or concerns regarding your invoice.  ° °Our billing staff will not be able to assist you with questions regarding bills from these companies. ° °You will be contacted with the lab results as soon as they are available. The fastest way to get your results is to activate your My Chart account. Instructions are located on the last page of this paperwork. If you have not heard from us regarding the results in 2 weeks, please contact this office. °  ° ° ° °

## 2018-07-14 NOTE — Progress Notes (Signed)
CC'D TO PCP AND SCHEDULED  °

## 2018-07-14 NOTE — Progress Notes (Signed)
PATIENT SCHEDULED  °

## 2018-08-02 ENCOUNTER — Ambulatory Visit (HOSPITAL_COMMUNITY)
Admission: EM | Admit: 2018-08-02 | Discharge: 2018-08-02 | Disposition: A | Discharge status: Home / Self Care | Payer: Managed Care, Other (non HMO) | Attending: Physician Assistant | Admitting: Physician Assistant

## 2018-08-02 ENCOUNTER — Encounter (HOSPITAL_COMMUNITY): Payer: Self-pay | Admitting: Emergency Medicine

## 2018-08-02 DIAGNOSIS — N760 Acute vaginitis: Secondary | ICD-10-CM | POA: Diagnosis not present

## 2018-08-02 MED ORDER — METRONIDAZOLE 500 MG PO TABS: 500.0000 mg | ORAL_TABLET | Freq: Two times a day (BID) | ORAL | 0 refills | Status: DC

## 2018-08-02 MED ORDER — FLUCONAZOLE 150 MG PO TABS: 150.0000 mg | ORAL_TABLET | Freq: Every day | ORAL | 0 refills | Status: DC

## 2018-08-02 NOTE — ED Triage Notes (Signed)
Pt states she has hx of BV and took a bath with soap and now has vaginal irritation and discharge. Denies pain.

## 2018-08-02 NOTE — Discharge Instructions (Signed)
You were treated BV. Start flagyl as directed. Also called in diflucan, can take if showing signs of yeast infection. Refrain from sexual activity and alcohol use for the next 7 days. Monitor for any worsening of symptoms, fever, abdominal pain, nausea, vomiting, to follow up for reevaluation.

## 2018-08-02 NOTE — ED Provider Notes (Signed)
[facility]    CSN: [id number] Arrival date & time: 08/02/18  1329     History   Chief Complaint No chief complaint on file.   HPI Anne Underwood is a 22 y.o. female.   [age] year year old female comes in for 1 to 2-week history of vaginal irritation and discharge.  States milky white discharge with mild odor.  Low abdominal pain that is intermittent, patient states more likely due to current menstrual cycle.  Denies nausea, vomiting.  Denies fever, chills, night sweats.  Denies urinary symptoms such as frequency, dysuria, hematuria.  States has history of BV, and she recently took a bath with soap, and thinks this triggered her symptoms.  LMP 08/01/2018.  Sexually active with one female partner, no condom use.  On oral birth control.  No worries for STDs.     Past Medical History:  Diagnosis Date  . ADD (attention deficit disorder)   . Anxiety   . Family history of adverse reaction to anesthesia    MOTHER HAD PONV  . Ovarian cyst, follicular 10/14/2014  . Pelvic pain in female 10/14/2014    Patient Active Problem List   Diagnosis Date Noted  . Rectal bleeding 05/14/2018  . Abdominal pain 05/14/2018  . Constipation 05/14/2018  . Pelvic pain in female 10/14/2014  . Ovarian cyst, follicular 10/14/2014  . ADD (attention deficit disorder) 07/02/2014  . Headache 12/12/2013  . Panic attacks 11/03/2013  . Palpitations 11/03/2013    Past Surgical History:  Procedure Laterality Date  . COLONOSCOPY N/A 07/04/2018   Procedure: COLONOSCOPY;  Surgeon: West BaliFields, Sandi L, MD;  Location: [facility];  Service: Endoscopy;  Laterality: N/A;  2:00pm  . LAPAROSCOPY N/A 10/15/2014   Procedure: LAPAROSCOPY OPERATIVE;  Surgeon: Sherian ReinJody Bovard-Stuckert, MD;  Location: [facility];  Service: Gynecology;  Laterality: N/A;  . OVARIAN CYST REMOVAL Right 10/15/2014   Procedure: OVARIAN CYSTECTOMY;  Surgeon: Sherian ReinJody Bovard-Stuckert, MD;  Location: [facility];  Service: Gynecology;  Laterality: Right;  Dr only  needs 1hr OR time  . WISDOM TOOTH EXTRACTION      OB History   None      Home Medications    Prior to Admission medications   Medication Sig Start Date End Date Taking? Authorizing Provider  bismuth subsalicylate (PEPTO BISMOL) 262 MG chewable tablet Chew 524 mg by mouth as needed for indigestion.    [provider]  escitalopram (LEXAPRO) 10 MG tablet Take 0.5 tablets (5 mg total) by mouth at bedtime. 07/12/18   Myles LippsSantiago, Irma M, MD  fluconazole (DIFLUCAN) 150 MG tablet Take 1 tablet (150 mg total) by mouth daily. Take second dose 72 hours later if symptoms still persists. 08/02/18   Cathie HoopsYu, Marium Ragan V, PA-C  ibuprofen (ADVIL,MOTRIN) 200 MG tablet Take 200-400 mg by mouth daily as needed for headache or moderate pain.    [provider]  metroNIDAZOLE (FLAGYL) 500 MG tablet Take 1 tablet (500 mg total) by mouth 2 (two) times daily. 08/02/18   Cathie HoopsYu, Ardell Aaronson V, PA-C  naproxen sodium (ALEVE) 220 MG tablet Take 220 mg by mouth daily as needed (pain).    [provider]  norethindrone-ethinyl estradiol (MICROGESTIN,JUNEL,LOESTRIN) 1-20 MG-MCG tablet TAKE (1) TABLET BY MOUTH ONCE DAILY. Patient taking differently: Take 1 tablet by mouth at bedtime.  08/19/17   Salley Scarleturham, [place] F, MD    Family History Family History  Problem Relation Age of Onset  . Healthy Mother   . Cancer Mother 3250  Breast Cancer  . Healthy Father   . COPD Maternal Grandfather   . Heart attack Paternal Grandfather   . Colon cancer Neg Hx   . Colon polyps Neg Hx     Social History Social History   Tobacco Use  . Smoking status: Never Smoker  . Smokeless tobacco: Never Used  Substance Use Topics  . Alcohol use: Yes    Comment: occasionally  . Drug use: No     Allergies   Patient has no known allergies.   Review of Systems Review of Systems  Reason unable to perform ROS: See HPI as above.     Physical Exam Triage Vital Signs ED Triage Vitals [08/02/18 1403]  Enc Vitals Group      BP 111/73     Pulse Rate 72     Resp 14     Temp 98.5 F (36.9 C)     Temp src      SpO2 99 %     Weight      Height      Head Circumference      Peak Flow      Pain Score 0     Pain Loc      Pain Edu?      Excl. in GC?    No data found.  Updated Vital Signs BP 111/73   Pulse 72   Temp 98.5 F (36.9 C)   Resp 14   SpO2 99%   Physical Exam  Constitutional: She is oriented to person, place, and time. She appears well-developed and well-nourished. No distress.  HENT:  Head: Normocephalic and atraumatic.  Eyes: Pupils are equal, round, and reactive to light. Conjunctivae are normal.  Cardiovascular: Normal rate, regular rhythm and normal heart sounds. Exam reveals no gallop and no friction rub.  No murmur heard. Pulmonary/Chest: Effort normal and breath sounds normal. She has no wheezes. She has no rales.  Abdominal: Soft. Bowel sounds are normal. She exhibits no mass. There is no tenderness. There is no rebound, no guarding and no CVA tenderness.  Neurological: She is alert and oriented to person, place, and time.  Skin: Skin is warm and dry.  Psychiatric: She has a normal mood and affect. Her behavior is normal. Judgment normal.     UC Treatments / Results  Labs (all labs ordered are listed, but only abnormal results are displayed) Labs Reviewed - No data to display  EKG None  Radiology No results found.  Procedures Procedures (including critical care time)  Medications Ordered in UC Medications - No data to display  Initial Impression / Assessment and Plan / UC Course  I have reviewed the triage vital signs and the nursing notes.  Pertinent labs & imaging results that were available during my care of the patient were reviewed by me and considered in my medical decision making (see chart for details).    Patient treated for BV with Flagyl.  Diflucan called in, can take for yeast symptoms.  Offered cytology testing, patient would like to defer.  Return  precautions given.  Patient expresses understanding and agrees to plan.  Final Clinical Impressions(s) / UC Diagnoses   Final diagnoses:  Acute vaginitis    ED Prescriptions    Medication Sig Dispense Auth. Provider   metroNIDAZOLE (FLAGYL) 500 MG tablet Take 1 tablet (500 mg total) by mouth 2 (two) times daily. 14 tablet Brocha Gilliam V, PA-C   fluconazole (DIFLUCAN) 150 MG tablet Take 1 tablet (150 mg total)  by mouth daily. Take second dose 72 hours later if symptoms still persists. 2 tablet Threasa Alpha, PA-C 08/02/18 1423

## 2018-08-06 ENCOUNTER — Ambulatory Visit: Payer: Managed Care, Other (non HMO) | Admitting: Nurse Practitioner

## 2018-08-14 ENCOUNTER — Ambulatory Visit: Payer: Managed Care, Other (non HMO) | Admitting: Nurse Practitioner

## 2018-09-09 ENCOUNTER — Ambulatory Visit: Payer: Self-pay | Admitting: *Deleted

## 2018-09-09 NOTE — Telephone Encounter (Signed)
Pt calling requesting a f/u appt with PCP in regards to the medication Lexapro. Pt states she has been on Lexapro for approximately 5 months but states that it has not been as effective within the last week or two. Pt states she has been experiencing pain during intercourse and can't achieve an orgasm. Pt scheduled for earliest available appt with PCP and placed on the waitlist as well. Pt advised to seek treatment in Urgent Care or the ED if symptoms become worse before scheduled appt. Pt verbalized understanding.  Reason for Disposition . Caller has URGENT medication question about med that PCP prescribed and triager unable to answer question  Protocols used: MEDICATION QUESTION CALL-A-AH

## 2018-09-11 NOTE — Telephone Encounter (Signed)
FYI to provider

## 2018-09-11 NOTE — Telephone Encounter (Signed)
Please schedule to be seen, thanks

## 2018-09-12 NOTE — Telephone Encounter (Signed)
Patient has an appointment on 10/16/18 with Dr Raynelle Bring are no openings before this other than sameday slots. Do you want use to get her in sooner and is it ok to put in same day.

## 2018-09-15 NOTE — Telephone Encounter (Signed)
SDA ok, thanks

## 2018-09-16 ENCOUNTER — Telehealth: Payer: Self-pay | Admitting: Family Medicine

## 2018-09-16 NOTE — Telephone Encounter (Signed)
LVM for pt to call the office and schedule an appt. PER DR. SANTIAGO, sameday slot is ok. When pt calls back, please schedule an appt with Dr. Leretha Pol if she has something available. If a sameday slot is needed, please call [facility] and ask for Gaston Islam or Brandi. Thank you!

## 2018-09-26 ENCOUNTER — Ambulatory Visit: Payer: Self-pay | Admitting: Adult Health

## 2018-09-26 VITALS — BP 100/76 | HR 97 | Temp 98.2°F | Resp 14

## 2018-09-26 DIAGNOSIS — J029 Acute pharyngitis, unspecified: Secondary | ICD-10-CM

## 2018-09-26 DIAGNOSIS — J020 Streptococcal pharyngitis: Secondary | ICD-10-CM

## 2018-09-26 LAB — POCT RAPID STREP A (OFFICE): Rapid Strep A Screen: POSITIVE — AB

## 2018-09-26 MED ORDER — AMOXICILLIN 875 MG PO TABS: 875.0000 mg | ORAL_TABLET | Freq: Two times a day (BID) | ORAL | 0 refills | Status: DC

## 2018-09-26 MED ORDER — ALBUTEROL SULFATE HFA 108 (90 BASE) MCG/ACT IN AERS: 1.0000 | INHALATION_SPRAY | Freq: Four times a day (QID) | RESPIRATORY_TRACT | 0 refills | Status: DC | PRN

## 2018-09-26 NOTE — Patient Instructions (Signed)
Pharyngitis  Pharyngitis is a sore throat (pharynx). This is when there is redness, pain, and swelling in your throat. Most of the time, this condition gets better on its own. In some cases, you may need medicine. Follow these instructions at home:  Take over-the-counter and prescription medicines only as told by your doctor. ? If you were prescribed an antibiotic medicine, take it as told by your doctor. Do not stop taking the antibiotic even if you start to feel better. ? Do not give children aspirin. Aspirin has been linked to Reye syndrome.  Drink enough water and fluids to keep your pee (urine) clear or pale yellow.  Get a lot of rest.  Rinse your mouth (gargle) with a salt-water mixture 3-4 times a day or as needed. To make a salt-water mixture, completely dissolve -1 tsp of salt in 1 cup of warm water.  If your doctor approves, you may use throat lozenges or sprays to soothe your throat. Contact a doctor if:  You have large, tender lumps in your neck.  You have a rash.  You cough up green, yellow-brown, or bloody spit. Get help right away if:  You have a stiff neck.  You drool or cannot swallow liquids.  You cannot drink or take medicines without throwing up.  You have very bad pain that does not go away with medicine.  You have problems breathing, and it is not from a stuffy nose.  You have new pain and swelling in your knees, ankles, wrists, or elbows. Summary  Pharyngitis is a sore throat (pharynx). This is when there is redness, pain, and swelling in your throat.  If you were prescribed an antibiotic medicine, take it as told by your doctor. Do not stop taking the antibiotic even if you start to feel better.  Most of the time, pharyngitis gets better on its own. Sometimes, you may need medicine. This information is not intended to replace advice given to you by your health care provider. Make sure you discuss any questions you have with your health care  provider. Document Released: 01/30/2008 Document Revised: 09/18/2016 Document Reviewed: 09/18/2016 Elsevier Interactive Patient Education  2019 Elsevier Inc. Amoxicillin capsules or tablets What is this medicine? AMOXICILLIN (a mox i SIL in) is a penicillin antibiotic. It is used to treat certain kinds of bacterial infections. It will not work for colds, flu, or other viral infections. This medicine may be used for other purposes; ask your health care provider or pharmacist if you have questions. COMMON BRAND NAME(S): Amoxil, Moxilin, Sumox, Trimox What should I tell my health care provider before I take this medicine? They need to know if you have any of these conditions: -asthma -kidney disease -an unusual or allergic reaction to amoxicillin, other penicillins, cephalosporin antibiotics, other medicines, foods, dyes, or preservatives -pregnant or trying to get pregnant -breast-feeding How should I use this medicine? Take this medicine by mouth with a glass of water. Follow the directions on your prescription label. You may take this medicine with food or on an empty stomach. Take your medicine at regular intervals. Do not take your medicine more often than directed. Take all of your medicine as directed even if you think your are better. Do not skip doses or stop your medicine early. Talk to your pediatrician regarding the use of this medicine in children. While this drug may be prescribed for selected conditions, precautions do apply. Overdosage: If you think you have taken too much of this medicine contact a poison  control center or emergency room at once. NOTE: This medicine is only for you. Do not share this medicine with others. What if I miss a dose? If you miss a dose, take it as soon as you can. If it is almost time for your next dose, take only that dose. Do not take double or extra doses. What may interact with this medicine? -amiloride -birth control  pills -chloramphenicol -macrolides -probenecid -sulfonamides -tetracyclines This list may not describe all possible interactions. Give your health care provider a list of all the medicines, herbs, non-prescription drugs, or dietary supplements you use. Also tell them if you smoke, drink alcohol, or use illegal drugs. Some items may interact with your medicine. What should I watch for while using this medicine? Tell your doctor or health care professional if your symptoms do not improve in 2 or 3 days. Take all of the doses of your medicine as directed. Do not skip doses or stop your medicine early. If you are diabetic, you may get a false positive result for sugar in your urine with certain brands of urine tests. Check with your doctor. Do not treat diarrhea with over-the-counter products. Contact your doctor if you have diarrhea that lasts more than 2 days or if the diarrhea is severe and watery. What side effects may I notice from receiving this medicine? Side effects that you should report to your doctor or health care professional as soon as possible: -allergic reactions like skin rash, itching or hives, swelling of the face, lips, or tongue -breathing problems -dark urine -redness, blistering, peeling or loosening of the skin, including inside the mouth -seizures -severe or watery diarrhea -trouble passing urine or change in the amount of urine -unusual bleeding or bruising -unusually weak or tired -yellowing of the eyes or skin Side effects that usually do not require medical attention (report to your doctor or health care professional if they continue or are bothersome): -dizziness -headache -stomach upset -trouble sleeping This list may not describe all possible side effects. Call your doctor for medical advice about side effects. You may report side effects to FDA at 1-800-FDA-1088. Where should I keep my medicine? Keep out of the reach of children. Store between 68 and 77  degrees F (20 and 25 degrees C). Keep bottle closed tightly. Throw away any unused medicine after the expiration date. NOTE: This sheet is a summary. It may not cover all possible information. If you have questions about this medicine, talk to your doctor, pharmacist, or health care provider.  2019 Elsevier/Gold Standard (2007-11-04 14:10:59)  

## 2018-09-26 NOTE — Progress Notes (Signed)
[facility]  Subjective:     Patient ID: Anne MallowHalie M Underwood, female   DOB: 22-Dec-1995, 23 y.o.   MRN: [id number]  HPI   Blood pressure 100/76, pulse 97, temperature 98.2 F (36.8 C). No Known Allergies  Patient is a 23 year old female in no acute distress who comes to the clinic for complaints of sore throat, fatigue x 2 days. Denies any poist nasal drip or drainage/ congestion. She does report mild cough.   Denies any difficulty swallowing. Mild hoarseness intermittently.  Painful swallowing.  No LMP recorded. (Menstrual status: Oral contraceptives). Denies any chance of pregnancy.   Chills at night last night.   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   She felt heart racing after taking Nyquil last night, she also had a fever and mild chills. She did not take her temperature and denies any other cardiac symptoms at that time. She reports this heart racing lasted less than 15 seconds.   She [place] cardiologist in [place] who folloow her. Denies any chest pain, shortness of breath. Denies any edema.   Denies any recent illness or hospitalizations.   Patient  denies any, rash, chest pain, shortness of breath, nausea, vomiting, or diarrhea.   No Known Allergies  Patient Active Problem List   Diagnosis Date Noted  . Rectal bleeding 05/14/2018  . Abdominal pain 05/14/2018  . Constipation 05/14/2018  . Pelvic pain in female 10/14/2014  . Ovarian cyst, follicular 10/14/2014  . ADD (attention deficit disorder) 07/02/2014  . Headache 12/12/2013  . Panic attacks 11/03/2013  . Palpitations 11/03/2013     Current Outpatient Medications:  .  bismuth subsalicylate (PEPTO BISMOL) 262 MG chewable tablet, Chew 524 mg by mouth as needed for indigestion., Disp: , Rfl:  .  escitalopram (LEXAPRO) 10 MG tablet, Take 0.5 tablets (5 mg total) by mouth at bedtime., Disp: 90 tablet, Rfl: 1 .  fluconazole (DIFLUCAN) 150 MG tablet, Take 1 tablet (150 mg  total) by mouth daily. Take second dose 72 hours later if symptoms still persists., Disp: 2 tablet, Rfl: 0 .  ibuprofen (ADVIL,MOTRIN) 200 MG tablet, Take 200-400 mg by mouth daily as needed for headache or moderate pain., Disp: , Rfl:  .  metroNIDAZOLE (FLAGYL) 500 MG tablet, Take 1 tablet (500 mg total) by mouth 2 (two) times daily., Disp: 14 tablet, Rfl: 0 .  naproxen sodium (ALEVE) 220 MG tablet, Take 220 mg by mouth daily as needed (pain)., Disp: , Rfl:  .  norethindrone-ethinyl estradiol (MICROGESTIN,JUNEL,LOESTRIN) 1-20 MG-MCG tablet, TAKE (1) TABLET BY MOUTH ONCE DAILY. (Patient taking differently: Take 1 tablet by mouth at bedtime. ), Disp: 63 tablet, Rfl: 0  Review of Systems  Constitutional: Positive for chills and fever. Negative for activity change, appetite change, diaphoresis, fatigue and unexpected weight change.  HENT: Positive for ear pain and sore throat. Negative for congestion, dental problem, drooling, ear discharge, facial swelling, hearing loss, mouth sores, nosebleeds, postnasal drip, rhinorrhea, sinus pressure, sinus pain, sneezing, tinnitus, trouble swallowing and voice change.   Eyes: Negative for photophobia, pain, discharge, redness, itching and visual disturbance.  Respiratory: Positive for cough. Negative for apnea, choking, chest tightness, shortness of breath, wheezing and stridor.   Cardiovascular: Negative.        She reports she felt her heart speed up last night after taking nyquil product with decongestant.  Denied any chest pain, pain.  shortness of breath, dyspnea, or edema.  She also reports fever and  mild chills last pm at this time.   Gastrointestinal: Negative.   Genitourinary: Negative.   Musculoskeletal: Negative.   Skin: Negative.   Neurological: Positive for headaches (started two days ago. ). Negative for dizziness, tremors, seizures, syncope, facial asymmetry, speech difficulty, weakness, light-headedness and numbness.  Hematological: Negative.    Psychiatric/Behavioral: Negative.        Objective:   Physical Exam Constitutional:      General: She is not in acute distress.    Appearance: She is well-developed and normal weight. She is not ill-appearing, toxic-appearing or diaphoretic.  HENT:     Head: Normocephalic and atraumatic.     Right Ear: Hearing, tympanic membrane, ear canal and external ear normal. No drainage, swelling or tenderness. No middle ear effusion. Tympanic membrane is not erythematous.     Left Ear: Hearing, tympanic membrane, ear canal and external ear normal. No drainage, swelling or tenderness.  No middle ear effusion. Tympanic membrane is not erythematous.     Nose: Nose normal. No congestion or rhinorrhea.     Right Sinus: No maxillary sinus tenderness or frontal sinus tenderness.     Left Sinus: No maxillary sinus tenderness or frontal sinus tenderness.     Mouth/Throat:     Lips: Pink.     Mouth: Mucous membranes are moist. No oral lesions.     Pharynx: Oropharynx is clear. Uvula midline. Posterior oropharyngeal erythema present. No pharyngeal swelling, oropharyngeal exudate or uvula swelling.     Tonsils: Tonsillar exudate (white exudate bilateral ) present. No tonsillar abscesses. Swelling: 2+ on the right. 1+ on the left.  Eyes:     Conjunctiva/sclera: Conjunctivae normal.  Neck:     Musculoskeletal: Full passive range of motion without pain, normal range of motion and neck supple.     Thyroid: No thyromegaly.     Trachea: Trachea and phonation normal.     Comments: No stridor.  Airway patent  Cardiovascular:     Rate and Rhythm: Normal rate and regular rhythm.  No extrasystoles are present.    Chest Wall: PMI is not displaced. No thrill.     Pulses: Normal pulses.     Heart sounds: Normal heart sounds. No friction rub. No gallop.   Pulmonary:     Effort: Pulmonary effort is normal. No respiratory distress.     Breath sounds: Normal breath sounds. No stridor. No wheezing, rhonchi or rales.   Chest:     Chest wall: No tenderness.  Abdominal:     Palpations: Abdomen is soft.  Lymphadenopathy:     Cervical: Cervical adenopathy present.  Skin:    General: Skin is warm and dry.     Capillary Refill: Capillary refill takes less than 2 seconds.  Neurological:     General: No focal deficit present.     Mental Status: She is alert and oriented to person, place, and time.  Psychiatric:        Mood and Affect: Mood normal.        Behavior: Behavior normal.        Assessment:     Acute streptococcal pharyngitis  Sore throat - Plan: POCT Rapid Strep A-Office (CPT [id number])  Results for orders placed or performed in visit on 09/26/18 (from the past 24 hour(s))  POCT Rapid Strep A-Office (CPT [id number])     Status: Abnormal   Collection Time: 09/26/18  2:18 PM  Result Value Ref Range   Rapid Strep A Screen Positive (A) Negative  Plan:     Gave and reviewed After Visit Summary(AVS) with patient.   Meds ordered this encounter  Medications  . amoxicillin (AMOXIL) 875 MG tablet    Sig: Take 1 tablet (875 mg total) by mouth 2 (two) times daily.    Dispense:  20 tablet    Refill:  0  . albuterol (PROVENTIL HFA;VENTOLIN HFA) 108 (90 Base) MCG/ACT inhaler    Sig: Inhale 1 puff into the lungs every 6 (six) hours as needed for wheezing or shortness of breath (as needed follow up with your primary care for refills).    Dispense:  1 Inhaler    Refill:  0   Advised salt water gargles, throat lozenges, hydration.     She denies any wheezing. She requests refill of her maintenance albuterol inhaler - advised will refill x 1 and she needs to follow up with her PCP for any additional refills.   Avoid any decongestant products given your report of SVT history ( ECHO normal and Holter showed sinus and sinus tachycardia) Follow up with your cardiologist and your Myles Lipps, MD PCP given no acute symptoms at today's visit.  Follow up with your primary care within 1 week and sooner  if needed.  Discussed RED FLAG symptoms and when to Call 911 and seek emergency care immediately.  Advised patient call the office or your primary care doctor for an appointment if no improvement within 72 hours or if any symptoms change or worsen at any time  Advised ER or urgent Care if after hours or on weekend. Call 911 for emergency symptoms at any time.Patinet verbalized understanding of all instructions given/reviewed and treatment plan and has no further questions or concerns at this time.    Patient verbalized understanding of all instructions given and denies any further questions at this time.

## 2018-10-16 ENCOUNTER — Ambulatory Visit: Payer: Self-pay | Admitting: Family Medicine

## 2018-10-16 ENCOUNTER — Encounter

## 2018-11-13 ENCOUNTER — Ambulatory Visit: Payer: Managed Care, Other (non HMO) | Admitting: Nurse Practitioner
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
## 2019-02-02 ENCOUNTER — Ambulatory Visit: Payer: Managed Care, Other (non HMO) | Admitting: Nurse Practitioner

## 2019-02-02 NOTE — Progress Notes (Deleted)
For follow-up on abdominal pain, constipation, rectal bleeding.  The patient was last seen in our office 05/14/2018 for the same.  At that time noted multiyear history of IBS type symptoms.  Mostly constipated for 8 out of 10 stools with [place] 1-2 stools and straining as well as incomplete emptying.  Associated nausea, less frequently vomiting.  Noted hematochezia which was not often but when it occurred "was a lot of blood."  No previous colonoscopy.  SVT previously diagnosed by Holter monitor and states she "passes out easily."  No other GI complaints.  Previously tried Bentyl without success.  Recommended trial of Linzess 72 mcg and request a progress report in 1 to 2 weeks, colonoscopy, follow-up in 3 months.  The patient called our office 2 weeks later stating Linzess upset her stomach and causes cramping and diarrhea.  Thinks it is because "my body is sensitive to medicines."  Recommended the patient pick up samples of Amitiza 8 mcg twice daily on a full stomach.  No further communication from the patient related to samples.  Colonoscopy was completed 07/04/2018 and found essentially normal colon, internal hemorrhoids, random colon biopsies for evaluation of microscopic colitis.  Surgical pathology found the biopsies to be benign colonic mucosa.  Recommended high-fiber diet, drink adequate water, low FODMAP diet, Preparation H 4 times a day as needed, follow-up in 4 months.  Today she states

## 2019-02-04 ENCOUNTER — Telehealth: Payer: Managed Care, Other (non HMO) | Admitting: Adult Health

## 2019-02-04 ENCOUNTER — Other Ambulatory Visit: Payer: Self-pay

## 2019-02-04 ENCOUNTER — Telehealth: Payer: Self-pay | Admitting: General Practice

## 2019-02-04 ENCOUNTER — Encounter: Payer: Self-pay | Admitting: Adult Health

## 2019-02-04 DIAGNOSIS — R5383 Other fatigue: Secondary | ICD-10-CM

## 2019-02-04 DIAGNOSIS — H9203 Otalgia, bilateral: Secondary | ICD-10-CM

## 2019-02-04 DIAGNOSIS — R6889 Other general symptoms and signs: Secondary | ICD-10-CM | POA: Diagnosis not present

## 2019-02-04 DIAGNOSIS — J029 Acute pharyngitis, unspecified: Secondary | ICD-10-CM

## 2019-02-04 DIAGNOSIS — Z20822 Contact with and (suspected) exposure to covid-19: Secondary | ICD-10-CM

## 2019-02-04 MED ORDER — AMOXICILLIN 875 MG PO TABS: 875.0000 mg | ORAL_TABLET | Freq: Two times a day (BID) | ORAL | 0 refills | Status: DC

## 2019-02-04 MED ORDER — PREDNISONE 10 MG (21) PO TBPK: ORAL_TABLET | ORAL | 0 refills | Status: DC

## 2019-02-04 NOTE — Telephone Encounter (Signed)
Pt has been scheduled covid-19 testing.   Scheduled with pt directly.  Pt was referred by: Doreen Beam, FNP

## 2019-02-04 NOTE — Telephone Encounter (Signed)
-----   Message from Doreen Beam, [place] sent at 02/04/2019 12:33 PM EDT ----- Symptomatic needs testing at [facility] - Covid 19  Testing. Please call patient to schedule.

## 2019-02-04 NOTE — Telephone Encounter (Signed)
Pt has been scheduled for covid testing.  ° °

## 2019-02-04 NOTE — Addendum Note (Signed)
Addended by: Denman George on: 02/04/2019 12:53 PM   Modules accepted: Orders

## 2019-02-04 NOTE — Progress Notes (Addendum)
[facility]  Virtual Visit via Video Note  I connected with Anne Underwood on 02/04/19 at 12:00 PM EDT by a video enabled telemedicine application and verified that I am speaking with the correct person using two identifiers.  Location: Patient: at her office  Provider[facility] [address] [facility] [facility] [place], [place]   I discussed the limitations of evaluation and management by telemedicine and the availability of in person appointments. The patient expressed understanding and agreed to proceed.  History of Present Illness: Patient is a 23 year old female in no acute distress to call the office for a virtual visit.  She reports she started with a sore throat on 02/03/2019.  She reports that she feels as if her lymph nodes and tonsils are swollen.  She has a mild headache.  She reports fatigue over the last 4 days. She denies any difficulty swallowing, though it is painful she is able to eat and drink food.  Denies cough, nasal discharge, or congestion.  Patient  denies any fever,chills, rash, chest pain, shortness of breath, nausea, vomiting, or diarrhea.   She denies any known exposure to COVID-19, she does work and DSS with adult patients.  She also does have a history of strep throat she last had 09/26/2018 was treated with amoxicillin and resolved.  Observations/Objective:  Patient is alert and oriented and responsive to questions Engages in conversation with provider. Speaks in full sentences without any pauses without any shortness of breath or distress.  She makes eye contact on video.  Breathing appears normal, no retractions. She is pleasant.   Assessment and Plan:  Anne Underwood was seen today for sore throat, headache and fatigue.  Diagnoses and all orders for this visit:  Acute pharyngitis, unspecified etiology  Other fatigue  Suspected Covid-19 Virus Infection  Ear pain, bilateral  Other orders -     amoxicillin  (AMOXIL) 875 MG tablet; Take 1 tablet (875 mg total) by mouth 2 (two) times daily. -     predniSONE (STERAPRED UNI-PAK 21 TAB) 10 MG (21) TBPK tablet; PO: Take 6 tablets on day 1:Take 5 tablets day 2:Take 4 tablets day 3: Take 3 tablets day 4:Take 2 tablets day five: 5 Take 1 tablet day 6  Will have revaluation visit on 02/10/19  Follow Up Instructions: Symptoms seem typical of strep pharyngitis, there patient is not seen in the office testing could not be done. Cannot rule out COVID-19 given symptoms of sore throat, fatigue.  She denies any respiratory symptoms at this time.  Will order COVID-19 testing today, and have follow-up visit on 02/09/19 She reports that she is able to work from home, advised not to return to work per [employer] guidelines.   Advised patient call the office or your primary care doctor for an appointment if no improvement within 72 hours or if any symptoms change or worsen at any time  Advised ER or urgent Care if after hours or on weekend. Call 911 for emergency symptoms at any time.Patinet verbalized understanding of all instructions given/reviewed and treatment plan and has no further questions or concerns at this time.     I discussed the assessment and treatment plan with the patient. The patient was provided an opportunity to ask questions and all were answered. The patient agreed with the plan and demonstrated an understanding of the instructions.   The patient was advised to call back or seek an in-person evaluation if the symptoms worsen or if the condition fails  to improve as anticipated.  I provided 15 minutes of non-face-to-face time during this encounter.   Marcille Buffy, FNP

## 2019-02-04 NOTE — Patient Instructions (Addendum)
Amoxicillin capsules or tablets What is this medicine? AMOXICILLIN (a mox i SIL in) is a penicillin antibiotic. It is used to treat certain kinds of bacterial infections. It will not work for colds, flu, or other viral infections. This medicine may be used for other purposes; ask your health care provider or pharmacist if you have questions. COMMON BRAND NAME(S): Amoxil, Moxilin, Sumox, Trimox What should I tell my health care provider before I take this medicine? They need to know if you have any of these conditions: -asthma -kidney disease -an unusual or allergic reaction to amoxicillin, other penicillins, cephalosporin antibiotics, other medicines, foods, dyes, or preservatives -pregnant or trying to get pregnant -breast-feeding How should I use this medicine? Take this medicine by mouth with a glass of water. Follow the directions on your prescription label. You may take this medicine with food or on an empty stomach. Take your medicine at regular intervals. Do not take your medicine more often than directed. Take all of your medicine as directed even if you think your are better. Do not skip doses or stop your medicine early. Talk to your pediatrician regarding the use of this medicine in children. While this drug may be prescribed for selected conditions, precautions do apply. Overdosage: If you think you have taken too much of this medicine contact a poison control center or emergency room at once. NOTE: This medicine is only for you. Do not share this medicine with others. What if I miss a dose? If you miss a dose, take it as soon as you can. If it is almost time for your next dose, take only that dose. Do not take double or extra doses. What may interact with this medicine? -amiloride -birth control pills -chloramphenicol -macrolides -probenecid -sulfonamides -tetracyclines This list may not describe all possible interactions. Give your health care provider a list of all the  medicines, herbs, non-prescription drugs, or dietary supplements you use. Also tell them if you smoke, drink alcohol, or use illegal drugs. Some items may interact with your medicine. What should I watch for while using this medicine? Tell your doctor or health care professional if your symptoms do not improve in 2 or 3 days. Take all of the doses of your medicine as directed. Do not skip doses or stop your medicine early. If you are diabetic, you may get a false positive result for sugar in your urine with certain brands of urine tests. Check with your doctor. Do not treat diarrhea with over-the-counter products. Contact your doctor if you have diarrhea that lasts more than 2 days or if the diarrhea is severe and watery. What side effects may I notice from receiving this medicine? Side effects that you should report to your doctor or health care professional as soon as possible: -allergic reactions like skin rash, itching or hives, swelling of the face, lips, or tongue -breathing problems -dark urine -redness, blistering, peeling or loosening of the skin, including inside the mouth -seizures -severe or watery diarrhea -trouble passing urine or change in the amount of urine -unusual bleeding or bruising -unusually weak or tired -yellowing of the eyes or skin Side effects that usually do not require medical attention (report to your doctor or health care professional if they continue or are bothersome): -dizziness -headache -stomach upset -trouble sleeping This list may not describe all possible side effects. Call your doctor for medical advice about side effects. You may report side effects to FDA at 1-800-FDA-1088. Where should I keep my medicine? Keep out   of the reach of children. Store between 68 and 77 degrees F (20 and 25 degrees C). Keep bottle closed tightly. Throw away any unused medicine after the expiration date. NOTE: This sheet is a summary. It may not cover all possible  information. If you have questions about this medicine, talk to your doctor, pharmacist, or health care provider.  2019 Elsevier/Gold Standard (2007-11-04 14:10:59) Pharyngitis  Pharyngitis is a sore throat (pharynx). This is when there is redness, pain, and swelling in your throat. Most of the time, this condition gets better on its own. In some cases, you may need medicine. Follow these instructions at home:  Take over-the-counter and prescription medicines only as told by your doctor. ? If you were prescribed an antibiotic medicine, take it as told by your doctor. Do not stop taking the antibiotic even if you start to feel better. ? Do not give children aspirin. Aspirin has been linked to Reye syndrome.  Drink enough water and fluids to keep your pee (urine) clear or pale yellow.  Get a lot of rest.  Rinse your mouth (gargle) with a salt-water mixture 3-4 times a day or as needed. To make a salt-water mixture, completely dissolve -1 tsp of salt in 1 cup of warm water.  If your doctor approves, you may use throat lozenges or sprays to soothe your throat. Contact a doctor if:  You have large, tender lumps in your neck.  You have a rash.  You cough up green, yellow-brown, or bloody spit. Get help right away if:  You have a stiff neck.  You drool or cannot swallow liquids.  You cannot drink or take medicines without throwing up.  You have very bad pain that does not go away with medicine.  You have problems breathing, and it is not from a stuffy nose.  You have new pain and swelling in your knees, ankles, wrists, or elbows. Summary  Pharyngitis is a sore throat (pharynx). This is when there is redness, pain, and swelling in your throat.  If you were prescribed an antibiotic medicine, take it as told by your doctor. Do not stop taking the antibiotic even if you start to feel better.  Most of the time, pharyngitis gets better on its own. Sometimes, you may need medicine.  This information is not intended to replace advice given to you by your health care provider. Make sure you discuss any questions you have with your health care provider. Document Released: 01/30/2008 Document Revised: 09/18/2016 Document Reviewed: 09/18/2016 Elsevier Interactive Patient Education  2019 Elsevier Inc.    Person Under Monitoring Name: Anne Underwood  Location: [address] [place] [id number]   Infection Prevention Recommendations for Individuals Confirmed to have, or Being Evaluated for, 2019 Novel Coronavirus (COVID-19) Infection Who Receive Care at Home  Individuals who are confirmed to have, or are being evaluated for, COVID-19 should follow the prevention steps below until a healthcare provider or local or state health department says they can return to normal activities.  Stay home except to get medical care You should restrict activities outside your home, except for getting medical care. Do not go to work, school, or public areas, and do not use public transportation or taxis.  Call ahead before visiting your doctor Before your medical appointment, call the healthcare provider and tell them that you have, or are being evaluated for, COVID-19 infection. This will help the healthcare provider's office take steps to keep other people from getting infected. Ask  your healthcare provider to call the local or state health department.  Monitor your symptoms Seek prompt medical attention if your illness is worsening (e.g., difficulty breathing). Before going to your medical appointment, call the healthcare provider and tell them that you have, or are being evaluated for, COVID-19 infection. Ask your healthcare provider to call the local or state health department.  Wear a facemask You should wear a facemask that covers your nose and mouth when you are in the same room with other people and when you visit a healthcare provider. People who live with or visit  you should also wear a facemask while they are in the same room with you.  Separate yourself from other people in your home As much as possible, you should stay in a different room from other people in your home. Also, you should use a separate bathroom, if available.  Avoid sharing household items You should not share dishes, drinking glasses, cups, eating utensils, towels, bedding, or other items with other people in your home. After using these items, you should wash them thoroughly with soap and water.  Cover your coughs and sneezes Cover your mouth and nose with a tissue when you cough or sneeze, or you can cough or sneeze into your sleeve. Throw used tissues in a lined trash can, and immediately wash your hands with soap and water for at least 20 seconds or use an alcohol-based hand rub.  Wash your [employer] Wash your hands often and thoroughly with soap and water for at least 20 seconds. You can use an alcohol-based hand sanitizer if soap and water are not available and if your hands are not visibly dirty. Avoid touching your eyes, nose, and mouth with unwashed hands.   Prevention Steps for Caregivers and Household Members of Individuals Confirmed to have, or Being Evaluated for, COVID-19 Infection Being Cared for in the Home  If you live with, or provide care at home for, a person confirmed to have, or being evaluated for, COVID-19 infection please follow these guidelines to prevent infection:  Follow healthcare provider's instructions Make sure that you understand and can help the patient follow any healthcare provider instructions for all care.  Provide for the patient's basic needs You should help the patient with basic needs in the home and provide support for getting groceries, prescriptions, and other personal needs.  Monitor the patient's symptoms If they are getting sicker, call his or her medical provider and tell them that the patient has, or is being evaluated for,  COVID-19 infection. This will help the healthcare provider's office take steps to keep other people from getting infected. Ask the healthcare provider to call the local or state health department.  Limit the number of people who have contact with the patient  If possible, have only one caregiver for the patient.  Other household members should stay in another home or place of residence. If this is not possible, they should stay  in another room, or be separated from the patient as much as possible. Use a separate bathroom, if available.  Restrict visitors who do not have an essential need to be in the home.  Keep older adults, very young children, and other sick people away from the patient Keep older adults, very young children, and those who have compromised immune systems or chronic health conditions away from the patient. This includes people with chronic heart, lung, or kidney conditions, diabetes, and cancer.  Ensure good ventilation Make sure that shared spaces in  the home have good air flow, such as from an air conditioner or an opened window, weather permitting.  Wash your hands often  Wash your hands often and thoroughly with soap and water for at least 20 seconds. You can use an alcohol based hand sanitizer if soap and water are not available and if your hands are not visibly dirty.  Avoid touching your eyes, nose, and mouth with unwashed hands.  Use disposable paper towels to dry your hands. If not available, use dedicated cloth towels and replace them when they become wet.  Wear a facemask and gloves  Wear a disposable facemask at all times in the room and gloves when you touch or have contact with the patient's blood, body fluids, and/or secretions or excretions, such as sweat, saliva, sputum, nasal mucus, vomit, urine, or feces.  Ensure the mask fits over your nose and mouth tightly, and do not touch it during use.  Throw out disposable facemasks and gloves after using  them. Do not reuse.  Wash your hands immediately after removing your facemask and gloves.  If your personal clothing becomes contaminated, carefully remove clothing and launder. Wash your hands after handling contaminated clothing.  Place all used disposable facemasks, gloves, and other waste in a lined container before disposing them with other household waste.  Remove gloves and wash your hands immediately after handling these items.  Do not share dishes, glasses, or other household items with the patient  Avoid sharing household items. You should not share dishes, drinking glasses, cups, eating utensils, towels, bedding, or other items with a patient who is confirmed to have, or being evaluated for, COVID-19 infection.  After the person uses these items, you should wash them thoroughly with soap and water.  Wash laundry thoroughly  Immediately remove and wash clothes or bedding that have blood, body fluids, and/or secretions or excretions, such as sweat, saliva, sputum, nasal mucus, vomit, urine, or feces, on them.  Wear gloves when handling laundry from the patient.  Read and follow directions on labels of laundry or clothing items and detergent. In general, wash and dry with the warmest temperatures recommended on the label.  Clean all areas the individual has used often  Clean all touchable surfaces, such as counters, tabletops, doorknobs, bathroom fixtures, toilets, phones, keyboards, tablets, and bedside tables, every day. Also, clean any surfaces that may have blood, body fluids, and/or secretions or excretions on them.  Wear gloves when cleaning surfaces the patient has come in contact with.  Use a diluted bleach solution (e.g., dilute bleach with 1 part bleach and 10 parts water) or a household disinfectant with a label that says EPA-registered for coronaviruses. To make a bleach solution at home, add 1 tablespoon of bleach to 1 quart (4 cups) of water. For a larger supply,  add  cup of bleach to 1 gallon (16 cups) of water.  Read labels of cleaning products and follow recommendations provided on product labels. Labels contain instructions for safe and effective use of the cleaning product including precautions you should take when applying the product, such as wearing gloves or eye protection and making sure you have good ventilation during use of the product.  Remove gloves and wash hands immediately after cleaning.  Monitor yourself for signs and symptoms of illness Caregivers and household members are considered close contacts, should monitor their health, and will be asked to limit movement outside of the home to the extent possible. Follow the monitoring steps for close contacts listed  on the symptom monitoring form.   ? If you have additional questions, contact your local health department or call the epidemiologist on call at [phone] (available 24/7). ? This guidance is subject to change. For the most up-to-date guidance from [facility], please refer to their website: [email or web address]

## 2019-02-09 ENCOUNTER — Encounter: Payer: Self-pay | Admitting: Adult Health

## 2019-02-09 ENCOUNTER — Other Ambulatory Visit: Payer: Self-pay

## 2019-02-09 ENCOUNTER — Telehealth: Payer: Managed Care, Other (non HMO) | Admitting: Adult Health

## 2019-02-09 DIAGNOSIS — T3695XA Adverse effect of unspecified systemic antibiotic, initial encounter: Secondary | ICD-10-CM | POA: Diagnosis not present

## 2019-02-09 DIAGNOSIS — B379 Candidiasis, unspecified: Secondary | ICD-10-CM | POA: Diagnosis not present

## 2019-02-09 MED ORDER — FLUCONAZOLE 150 MG PO TABS: 150.0000 mg | ORAL_TABLET | Freq: Once | ORAL | 0 refills | Status: AC

## 2019-02-09 NOTE — Patient Instructions (Signed)
Fluconazole tablets What is this medicine? FLUCONAZOLE (floo KON na zole) is an antifungal medicine. It is used to treat certain kinds of fungal or yeast infections. This medicine may be used for other purposes; ask your health care provider or pharmacist if you have questions. COMMON BRAND NAME(S): Diflucan What should I tell my health care provider before I take this medicine? They need to know if you have any of these conditions: -history of irregular heart beat -kidney disease -an unusual or allergic reaction to fluconazole, other azole antifungals, medicines, foods, dyes, or preservatives -pregnant or trying to get pregnant -breast-feeding How should I use this medicine? Take this medicine by mouth. Follow the directions on the prescription label. Do not take your medicine more often than directed. Talk to your pediatrician regarding the use of this medicine in children. Special care may be needed. This medicine has been used in children as young as 6 months of age. Overdosage: If you think you have taken too much of this medicine contact a poison control center or emergency room at once. NOTE: This medicine is only for you. Do not share this medicine with others. What if I miss a dose? If you miss a dose, take it as soon as you can. If it is almost time for your next dose, take only that dose. Do not take double or extra doses. What may interact with this medicine? Do not take this medicine with any of the following medications: -astemizole -certain medicines for irregular heart beat like dofetilide, dronedarone, quinidine -cisapride -erythromycin -lomitapide -other medicines that prolong the QT interval (cause an abnormal heart rhythm) -pimozide -terfenadine -thioridazine -tolvaptan -ziprasidone This medicine may also interact with the following medications: -antiviral medicines for HIV or AIDS -birth control pills -certain antibiotics like rifabutin, rifampin -certain  medicines for blood pressure like amlodipine, isradipine, felodipine, hydrochlorothiazide, losartan, nifedipine -certain medicines for cancer like cyclophosphamide, vinblastine, vincristine -certain medicines for cholesterol like atorvastatin, lovastatin, fluvastatin, simvastatin -certain medicines for depression, anxiety, or psychotic disturbances like amitriptyline, midazolam, nortriptyline, triazolam -certain medicines for diabetes like glipizide, glyburide, tolbutamide -certain medicines for pain like alfentanil, fentanyl, methadone -certain medicines for seizures like carbamazepine, phenytoin -certain medicines that treat or prevent blood clots like warfarin -halofantrine -medicines that lower your chance of fighting infection like cyclosporine, prednisone, tacrolimus -NSAIDS, medicines for pain and inflammation, like celecoxib, diclofenac, flurbiprofen, ibuprofen, meloxicam, naproxen -other medicines for fungal infections -sirolimus -theophylline -tofacitinib This list may not describe all possible interactions. Give your health care provider a list of all the medicines, herbs, non-prescription drugs, or dietary supplements you use. Also tell them if you smoke, drink alcohol, or use illegal drugs. Some items may interact with your medicine. What should I watch for while using this medicine? Visit your doctor or health care professional for regular checkups. If you are taking this medicine for a long time you may need blood work. Tell your doctor if your symptoms do not improve. Some fungal infections need many weeks or months of treatment to cure. Alcohol can increase possible damage to your liver. Avoid alcoholic drinks. If you have a vaginal infection, do not have sex until you have finished your treatment. You can wear a sanitary napkin. Do not use tampons. Wear freshly washed cotton, not synthetic, panties. What side effects may I notice from receiving this medicine? Side effects that  you should report to your doctor or health care professional as soon as possible: -allergic reactions like skin rash or itching, hives, swelling of the   lips, mouth, tongue, or throat -dark urine -feeling dizzy or faint -irregular heartbeat or chest pain -redness, blistering, peeling or loosening of the skin, including inside the mouth -trouble breathing -unusual bruising or bleeding -vomiting -yellowing of the eyes or skin Side effects that usually do not require medical attention (report to your doctor or health care professional if they continue or are bothersome): -changes in how food tastes -diarrhea -headache -stomach upset or nausea This list may not describe all possible side effects. Call your doctor for medical advice about side effects. You may report side effects to FDA at 1-800-FDA-1088. Where should I keep my medicine? Keep out of the reach of children. Store at room temperature below 30 degrees C (86 degrees F). Throw away any medicine after the expiration date. NOTE: This sheet is a summary. It may not cover all possible information. If you have questions about this medicine, talk to your doctor, pharmacist, or health care provider.  2019 Elsevier/Gold Standard (2013-03-21 19:37:38)  Vaginal Yeast infection, Adult  Vaginal yeast infection is a condition that causes vaginal discharge as well as soreness, swelling, and redness (inflammation) of the vagina. This is a common condition. Some women get this infection frequently. What are the causes? This condition is caused by a change in the normal balance of the yeast (candida) and bacteria that live in the vagina. This change causes an overgrowth of yeast, which causes the inflammation. What increases the risk? The condition is more likely to develop in women who:  Take antibiotic medicines.  Have diabetes.  Take birth control pills.  Are pregnant.  Douche often.  Have a weak body defense system (immune system).   Have been taking steroid medicines for a long time.  Frequently wear tight clothing. What are the signs or symptoms? Symptoms of this condition include:  White, thick, creamy vaginal discharge.  Swelling, itching, redness, and irritation of the vagina. The lips of the vagina (vulva) may be affected as well.  Pain or a burning feeling while urinating.  Pain during sex. How is this diagnosed? This condition is diagnosed based on:  Your medical history.  A physical exam.  A pelvic exam. Your health care provider will examine a sample of your vaginal discharge under a microscope. Your health care provider may send this sample for testing to confirm the diagnosis. How is this treated? This condition is treated with medicine. Medicines may be over-the-counter or prescription. You may be told to use one or more of the following:  Medicine that is taken by mouth (orally).  Medicine that is applied as a cream (topically).  Medicine that is inserted directly into the vagina (suppository). Follow these instructions at home:  Lifestyle  Do not have sex until your health care provider approves. Tell your sex partner that you have a yeast infection. That person should go to his or her health care provider and ask if they should also be treated.  Do not wear tight clothes, such as pantyhose or tight pants.  Wear breathable cotton underwear. General instructions  Take or apply over-the-counter and prescription medicines only as told by your health care provider.  Eat more yogurt. This may help to keep your yeast infection from returning.  Do not use tampons until your health care provider approves.  Try taking a sitz bath to help with discomfort. This is a warm water bath that is taken while you are sitting down. The water should only come up to your hips and should cover your   buttocks. Do this 3-4 times per day or as told by your health care provider.  Do not douche.  If you  have diabetes, keep your blood sugar levels under control.  Keep all follow-up visits as told by your health care provider. This is important. Contact a health care provider if:  You have a fever.  Your symptoms go away and then return.  Your symptoms do not get better with treatment.  Your symptoms get worse.  You have new symptoms.  You develop blisters in or around your vagina.  You have blood coming from your vagina and it is not your menstrual period.  You develop pain in your abdomen. Summary  Vaginal yeast infection is a condition that causes discharge as well as soreness, swelling, and redness (inflammation) of the vagina.  This condition is treated with medicine. Medicines may be over-the-counter or prescription.  Take or apply over-the-counter and prescription medicines only as told by your health care provider.  Do not douche. Do not have sex or use tampons until your health care provider approves.  Contact a health care provider if your symptoms do not get better with treatment or your symptoms go away and then return. This information is not intended to replace advice given to you by your health care provider. Make sure you discuss any questions you have with your health care provider. Document Released: 05/23/2005 Document Revised: 12/30/2017 Document Reviewed: 12/30/2017 Elsevier Interactive Patient Education  2019 Elsevier Inc.  

## 2019-02-09 NOTE — Care Plan (Signed)
Virtual Visit via Telephone Note  I connected with Anne Underwood on 02/09/19 at 10:00 AM EDT by telephone and verified that I am speaking with the correct person using two identifiers.  Location: Patient:  At her home  Provider: [facility], [place], [place]     I discussed the limitations, risks, security and privacy concerns of performing an evaluation and management service by telephone and the availability of in person appointments. I also discussed with the patient that there may be a patient responsible charge related to this service. The patient expressed understanding and agreed to proceed.  See patients MyChart message from today 02/09/19 Hello,  I am doing so much better! My tonsil swelling has gone down and my ears are no longer hurting. Although of this I am still tired. I am starting to have vaginal discomfort, I don't know if it could be from the medications. At this time there is no discharge.     I am not able to work from home, but due to the COVID testing I get to pull from those funds. I spoke with HR today and they are needing a note from you, my work email is [email or web address]     Thank you,  [name]     History of Present Illness: Patient is a 23 year old female in no acute distress who calls the clinic for a telephone follow up test.  She is taking her Amoxicillin and prednisone as directed. She reports she is feeling better- the highest her fever was 99.9 on Friday 02/06/19. She has been afebrile since. She reports her ears and throat is feeling much better.   She reports that with taking the antibiotic she has had vaginal itching and burning.  She described as a typical yeast infection she gets after antibiotics.  She does have a history of strep pharyngitis.   She denies any concerns with sexually transmitted disease, or any new partners and no past history of disease.       Observations/Objective:   Patient is alert and oriented and responsive to questions Engages in conversation with provider. Speaks in full sentences without any pauses without any shortness of breath or distress.   Covid test from 02/06/19 is still pending  She is able to return to work on 02/13/19- will wait on the test result and revaluate clinically at that time.  Assessment and Plan:  Diagnoses and all orders for this visit:  Antibiotic-induced yeast infection  Other orders -     fluconazole (DIFLUCAN) 150 MG tablet; Take 1 tablet (150 mg total) by mouth once for 1 dose.    Follow Up Instructions:  Will follow up when Covid test returns as to when to return to work.    I discussed the assessment and treatment plan with the patient. The patient was provided an opportunity to ask questions and all were answered. The patient agreed with the plan and demonstrated an understanding of the instructions.   The patient was advised to call back or seek an in-person evaluation if the symptoms worsen or if the condition fails to improve as anticipated.  I provided 10 minutes of non-face-to-face time during this encounter.   Marcille Buffy, FNP

## 2019-02-09 NOTE — Addendum Note (Signed)
Addended by: Judie Petit on: 02/09/2019 10:42 AM   Modules accepted: Level of Service

## 2019-02-09 NOTE — Progress Notes (Signed)
Anne Underwood, Anne Aline, FNP  Family Nurse Practitioner  Specialty:  [facility]  Care Plan  Sign when Signing Visit  Encounter Date:  02/09/2019          Sign when Signing Visit         Show:Clear all [x] Manual[x] Template[x] Copied  Added by: [x] Mehr Depaoli, Anne Aline, FNP  [] Hover for details Virtual Visit via Telephone Note  I connected withHalie Rozell Underwood on 02/09/19 at 10:00 AM EDT by telephoneand verified that I am speaking with the correct person using two identifiers.  Location: Patient:  At her home  Provider: [facility], [place], [place]    I discussed the limitations, risks, security and privacy concerns of performing an evaluation and management service by telephone and the availability of in person appointments. I also discussed with the patient that there may be a patient responsible charge related to this service. The patient expressed understanding and agreed to proceed.  See patients MyChart message from today 02/09/19 Hello,  I am doing so much better! My tonsil swelling has gone down and my ears are no longer hurting. Although of this I am still tired. I am starting to have vaginal discomfort, I don't know if it could be from the medications. At this time there is no discharge.     I am not able to work from home, but due to the COVID testing I get to pull from those funds. I spoke with HR today and they are needing a note from you, my work email is [email or web address]     Thank you,  Anne Underwood     History of Present Illness: Patient is a 23 year old female in no acute distress who calls the clinic for a telephone follow up test.  She is taking her Amoxicillin and prednisone as directed. She reports she is feeling better- the highest her fever was 99.9 on Friday 02/06/19. She has been afebrile since. She reports her ears and throat is feeling much better.   She reports that with taking the  antibiotic she has had vaginal itching and burning.  She described as a typical yeast infection she gets after antibiotics.  She does have a history of strep pharyngitis.   She denies any concerns with sexually transmitted disease, or any new partners and no past history of disease.     Observations/Objective:   Patient is alert and oriented and responsive to questions Engages in conversation with provider. Speaks in full sentences without any pauses without any shortness of breath or distress.   Covid test from 02/06/19 is still pending  She is able to return to work on 02/13/19- will wait on the test result and revaluate clinically at that time.  Assessment and Plan:  Diagnoses and all orders for this visit:  Antibiotic-induced yeast infection  Other orders -     fluconazole (DIFLUCAN) 150 MG tablet; Take 1 tablet (150 mg total) by mouth once for 1 dose.    Follow Up Instructions:  Will follow up when Covid test returns as to when to return to work.   I discussed the assessment and treatment plan with the patient. The patient was provided an opportunity to ask questions and all were answered. The patient agreed with the plan and demonstrated an understanding of the instructions.  The patient was advised to call back or seek an in-person evaluation if the symptoms worsen or if the condition fails to improve as anticipated.  I provided 10 minutes  of non-face-to-face time during this encounter.   Jairo BenMichelle Smith Keanen Dohse, FNP

## 2019-02-10 LAB — NOVEL CORONAVIRUS, NAA: SARS-CoV-2, NAA: NOT DETECTED

## 2019-02-11 ENCOUNTER — Telehealth: Payer: Self-pay | Admitting: Adult Health

## 2019-02-11 LAB — NOVEL CORONAVIRUS, NAA: SARS-CoV-2, NAA: NOT DETECTED

## 2019-02-11 NOTE — Addendum Note (Signed)
Addended by: Judie Petit on: 02/11/2019 02:13 PM   Modules accepted: Orders

## 2019-02-11 NOTE — Telephone Encounter (Signed)
Bretlyn Ward CMA, called [facility] at 1:30 PM to request patient's COVID test. Results were verified and resulted as SARS-CoV-2 not detected. Patient was informed of results. She reports she is feeling better and has had no symptoms for 5 days.  She is afebrile.  Her sore throat responded to her antibiotic 2 days after starting and all of her symptoms were resolving.  She denied any cold symptoms.  She did have a history of strep and clinically presented as if she had strep, COVID testing was done as precaution.  Patient is advised that she may return to work on 02/12/2019, she should wear a mask when returning to work and should also take her temperature every morning and every evening without any fever reducers and report any new or changing symptoms to this clinic or seek after-hours emergency care immediately  Patient verbalized understanding of all instructions given and denies any further questions at this time.

## 2019-03-04 ENCOUNTER — Telehealth: Payer: Self-pay | Admitting: Family Medicine

## 2019-03-04 NOTE — Telephone Encounter (Signed)
LVM to schedule appt to discuss side effects of Lexapro

## 2019-03-06 ENCOUNTER — Other Ambulatory Visit: Payer: Self-pay

## 2019-03-06 ENCOUNTER — Ambulatory Visit: Payer: Managed Care, Other (non HMO) | Admitting: Family Medicine

## 2019-03-06 ENCOUNTER — Encounter: Payer: Self-pay | Admitting: Family Medicine

## 2019-03-06 VITALS — BP 112/71 | HR 85 | Temp 98.4°F | Ht 63.0 in | Wt 155.0 lb

## 2019-03-06 DIAGNOSIS — F411 Generalized anxiety disorder: Secondary | ICD-10-CM | POA: Diagnosis not present

## 2019-03-06 MED ORDER — DULOXETINE HCL 30 MG PO CPEP: 30.0000 mg | ORAL_CAPSULE | Freq: Every day | ORAL | 3 refills | Status: DC

## 2019-03-06 NOTE — Patient Instructions (Signed)
° ° ° °  If you have lab work done today you will be contacted with your lab results within the next 2 weeks.  If you have not heard from us then please contact us. The fastest way to get your results is to register for My Chart. ° ° °IF you received an x-ray today, you will receive an invoice from [facility]. Please contact [facility] at [phone] with questions or concerns regarding your invoice.  ° °IF you received labwork today, you will receive an invoice from [facility]. Please contact [facility] at [phone] with questions or concerns regarding your invoice.  ° °Our billing staff will not be able to assist you with questions regarding bills from these companies. ° °You will be contacted with the lab results as soon as they are available. The fastest way to get your results is to activate your My Chart account. Instructions are located on the last page of this paperwork. If you have not heard from us regarding the results in 2 weeks, please contact this office. °  ° ° ° °

## 2019-03-06 NOTE — Progress Notes (Signed)
7/10/20204:55 PM  [facility] June 15, 1996, 23 y.o., female [id number]  Chief Complaint  Patient presents with   Medication Reaction    medication is causing her not to be able to sleep and also trouble reaching orgasims    HPI:   Patient is a 23 y.o. female with past medical history significant for GAD and IBS who presents today for followup  Last OV Nov 2019 Patient increased 10mg  several months ago but anxiety not controlled and having side effects such as sexual dysfunction and insomnia   Thinks she might have tried prozac in the past but she felt she was going to pass out  Has h/o ADD Tried multiple medications which she did not tolerate  Gad 7 noted  GAD 7 : Generalized Anxiety Score 03/06/2019 07/12/2018 04/26/2018  Nervous, Anxious, on Edge 2 1 2   Control/stop worrying 2 1 3   Worry too much - different things 2 1 3   Trouble relaxing 1 1 2   Restless 2 1 2   Easily annoyed or irritable 2 1 3   Afraid - awful might happen 1 1 2   Total GAD 7 Score 12 7 17   Anxiety Difficulty Somewhat difficult Not difficult at all Not difficult at all     [facility] 2[facility] 03/06/2019 07/12/2018 04/26/2018  Decreased Interest 0 0 0  Down, Depressed, Hopeless 0 0 1  PHQ - 2 Score 0 0 1  Altered sleeping - - 1  Tired, decreased energy - - 2  Change in appetite - - 1  Feeling bad or failure about yourself  - - 1  Trouble concentrating - - 2  Moving slowly or fidgety/restless - - 0  Suicidal thoughts - - 0  PHQ-9 Score - - 8  Difficult doing work/chores - - Not difficult at all    Fall Risk  03/06/2019 07/12/2018 04/26/2018 04/11/2018 06/11/2017  Falls in the past year? 0 0 No No No  Number falls in past yr: 0 - - - -  Injury with Fall? 0 - - - -     No Known Allergies  Prior to Admission medications   Medication Sig Start Date End Date Taking? Authorizing Provider  albuterol (PROVENTIL HFA;VENTOLIN HFA) 108 (90 Base) MCG/ACT inhaler Inhale 1 puff into the lungs every  6 (six) hours as needed for wheezing or shortness of breath (as needed follow up with your primary care for refills). 09/26/18  Yes Flinchum, Eula FriedMichelle S, FNP  amoxicillin (AMOXIL) 875 MG tablet Take 1 tablet (875 mg total) by mouth 2 (two) times daily. 02/04/19  Yes Flinchum, Eula FriedMichelle S, FNP  bismuth subsalicylate (PEPTO BISMOL) 262 MG chewable tablet Chew 524 mg by mouth as needed for indigestion.   Yes [provider]  escitalopram (LEXAPRO) 10 MG tablet Take 0.5 tablets (5 mg total) by mouth at bedtime. 07/12/18  Yes Myles LippsSantiago, Zaydenn Balaguer M, MD  ibuprofen (ADVIL,MOTRIN) 200 MG tablet Take 200-400 mg by mouth daily as needed for headache or moderate pain.   Yes [provider]  naproxen sodium (ALEVE) 220 MG tablet Take 220 mg by mouth daily as needed (pain).   Yes [provider]  norethindrone-ethinyl estradiol (MICROGESTIN,JUNEL,LOESTRIN) 1-20 MG-MCG tablet TAKE (1) TABLET BY MOUTH ONCE DAILY. Patient taking differently: Take 1 tablet by mouth at bedtime.  08/19/17  Yes Turner, Velna HatchetKawanta F, MD  predniSONE (STERAPRED UNI-PAK 21 TAB) 10 MG (21) TBPK tablet PO: Take 6 tablets on day 1:Take 5 tablets day 2:Take 4 tablets day 3: Take 3  tablets day 4:Take 2 tablets day five: 5 Take 1 tablet day 6 02/04/19  Yes Flinchum, Kelby Aline, FNP    Past Medical History:  Diagnosis Date   ADD (attention deficit disorder)    Anxiety    Family history of adverse reaction to anesthesia    MOTHER HAD PONV   Ovarian cyst, follicular 10/10/863   Pelvic pain in female 10/14/2014    Past Surgical History:  Procedure Laterality Date   COLONOSCOPY N/A 07/04/2018   Procedure: COLONOSCOPY;  Surgeon: Danie Binder, MD;  [facility];  Service: Endoscopy;  Laterality: N/A;  2:00pm   LAPAROSCOPY N/A 10/15/2014   Procedure: LAPAROSCOPY OPERATIVE;  Surgeon: Janyth Contes, MD;  Location: [facility];  Service: Gynecology;  Laterality: N/A;   OVARIAN CYST REMOVAL Right 10/15/2014    Procedure: OVARIAN CYSTECTOMY;  Surgeon: Janyth Contes, MD;  Location: [facility];  Service: Gynecology;  Laterality: Right;  Dr only needs 1hr OR time   WISDOM TOOTH EXTRACTION      Social History   Tobacco Use   Smoking status: Never Smoker   Smokeless tobacco: Never Used  Substance Use Topics   Alcohol use: Yes    Comment: occasionally    Family History  Problem Relation Age of Onset   Healthy Mother    Cancer Mother 45       Breast Cancer   Healthy Father    COPD Maternal Grandfather    Heart attack Paternal Grandfather    Colon cancer Neg Hx    Colon polyps Neg Hx     ROS Per hpi  OBJECTIVE:  Today's Vitals   03/06/19 1624  BP: 112/71  Pulse: 85  Temp: 98.4 F (36.9 C)  TempSrc: Oral  SpO2: 96%  Weight: 155 lb (70.3 kg)  Height: 5\' 3"  (1.6 m)   Body mass index is 27.46 kg/m.   Physical Exam Vitals signs and nursing note reviewed.  Constitutional:      Appearance: She is well-developed.  HENT:     Head: Normocephalic and atraumatic.  Eyes:     General: No scleral icterus.    Conjunctiva/sclera: Conjunctivae normal.     Pupils: Pupils are equal, round, and reactive to light.  Neck:     Musculoskeletal: Neck supple.  Pulmonary:     Effort: Pulmonary effort is normal.  Skin:    General: Skin is warm and dry.  Neurological:     Mental Status: She is alert and oriented to person, place, and time.     ASSESSMENT and PLAN  1. Generalized anxiety disorder Discussed treatment options, patient decided for trial of duloxetine. Reviewed r/se/b. Discussed increasing to 60mg  once a day in 3-4 weeks if anxiety not controlled. Consider ADD contribution to symptoms.   Other orders - DULoxetine (CYMBALTA) 30 MG capsule; Take 1 capsule (30 mg total) by mouth daily.  Return in about 3 months (around 06/06/2019).    Rutherford Guys, MD Primary Care at [place], [place] [address] Ph.  [phone] Fax [phone]

## 2019-03-07 ENCOUNTER — Encounter: Payer: Self-pay | Admitting: Family Medicine

## 2019-06-05 ENCOUNTER — Ambulatory Visit: Payer: Managed Care, Other (non HMO) | Admitting: Family Medicine

## 2019-06-20 ENCOUNTER — Other Ambulatory Visit: Payer: Self-pay | Admitting: Family Medicine

## 2019-06-21 NOTE — Telephone Encounter (Signed)
Requested Prescriptions  Pending Prescriptions Disp Refills  . DULoxetine (CYMBALTA) 30 MG capsule [Pharmacy Med Name: DULOXETINE HCL DR 30 MG CAP] 30 capsule 0    Sig: TAKE (1) CAPSULE BY MOUTH EVERY DAY.     Psychiatry: Antidepressants - SNRI Failed - 06/20/2019  9:33 AM      Failed - Completed PHQ-2 or PHQ-9 in the last 360 days.      Passed - Last BP in normal range    BP Readings from Last 1 Encounters:  03/06/19 112/71         Passed - Valid encounter within last 6 months    Recent Outpatient Visits          3 months ago Generalized anxiety disorder   [facility], Lilia Argue, MD   11 months ago Generalized anxiety disorder   [facility], Lilia Argue, MD   1 year ago Generalized anxiety disorder   [facility] at [place], [place] D, PA-C             30 day courtesy refill given; needs office visit.

## 2019-07-29 ENCOUNTER — Ambulatory Visit: Payer: Self-pay | Admitting: Family Medicine

## 2019-07-29 NOTE — Telephone Encounter (Signed)
Pt called with complaints of left nipple discharge that stated 07/29/2019; she says the discharge was thick and white; she has a tingling sensation in her right breast, but it is not painful; recommendatons made per nurse triage protocol; she verbalized understanding; she can be contacted at [phone]; the pt sees Dr Pamella Pert, [facility]; will route to office for scheduling.   Reason for Disposition . [1] Nipple discharge AND [2] not bloody (e.g., clear, white, yellow, brown, green)  Answer Assessment - Initial Assessment Questions 1. SYMPTOM: "What's the main symptom you're concerned about?"  (e.g., lump, pain, rash, nipple discharge)     Nipple discharge 2. LOCATION: "Where is the  located?"   right breast 3. ONSET: "When did  start?"     07/29/2019 4. PRIOR HISTORY: "Do you have any history of prior problems with your breasts?" (e.g., lumps, cancer, fibrocystic breast disease)    Pt's mother had breast CA 5. CAUSE: "What do you think is causing this symptom?"     Not sure 6. OTHER SYMPTOMS: "Do you have any other symptoms?" (e.g., fever, breast pain, redness or rash, nipple discharge)     Redness due to manipulation 7. PREGNANCY-BREASTFEEDING: "Is there any chance you are pregnant?" "When was your last menstrual period?" "Are you breastfeeding?"    Yes LMP 07/08/2019  Protocols used: BREAST [facility]

## 2019-07-30 NOTE — Telephone Encounter (Signed)
Pt scheduled to see dr Pamella Pert on 08/04/2019.

## 2019-08-04 ENCOUNTER — Ambulatory Visit: Payer: Managed Care, Other (non HMO) | Admitting: Family Medicine

## 2019-08-25 ENCOUNTER — Other Ambulatory Visit: Payer: Self-pay | Admitting: Family Medicine

## 2019-09-01 ENCOUNTER — Encounter: Payer: Self-pay | Admitting: Family Medicine

## 2019-09-01 ENCOUNTER — Other Ambulatory Visit: Payer: Self-pay

## 2019-09-01 ENCOUNTER — Ambulatory Visit (INDEPENDENT_AMBULATORY_CARE_PROVIDER_SITE_OTHER): Payer: Managed Care, Other (non HMO) | Admitting: Family Medicine

## 2019-09-01 VITALS — BP 109/78 | HR 88 | Temp 98.6°F | Ht 63.0 in | Wt 163.2 lb

## 2019-09-01 DIAGNOSIS — Z23 Encounter for immunization: Secondary | ICD-10-CM

## 2019-09-01 DIAGNOSIS — R7989 Other specified abnormal findings of blood chemistry: Secondary | ICD-10-CM

## 2019-09-01 DIAGNOSIS — N6452 Nipple discharge: Secondary | ICD-10-CM

## 2019-09-01 LAB — POCT URINE PREGNANCY: Preg Test, Ur: NEGATIVE

## 2019-09-01 NOTE — Progress Notes (Signed)
1/5/202110:16 AM  Anne Underwood Anne Underwood 06-Feb-1996, 24 y.o., female [id number]  Chief Complaint  Patient presents with  . brest discharge    pt had discharge from [facility]. the discharge was white and thick. per pt.    HPI:   Patient is a 24 y.o. female with past medical history significant for GAD, IBS ADD who presents today for left breast discharge  One time incident of LEFT breast white thick nipple discharge 2-3 weeks ago Noticed small amount on her nipple and then had more upon expression Reports it seems to have come from middle of nipple Denies any breast lumps or skin changes No headaches, vision changes, nausea, changes in temp tolerance, changes in her hair/skin, changes in BM, she has noticed mild irritability Started on cymbalta in July Takes OCPs, no interruptions, LMP about 2 weeks ago,  a week after nipple discharge Mother with postmenopausal breast cancer Strong fhx of ovarian cancer patient reports her mother had neg genetic test  Depression screen [facility] 2/9 09/01/2019 03/06/2019 07/12/2018  Decreased Interest 0 0 0  Down, Depressed, Hopeless 0 0 0  PHQ - 2 Score 0 0 0  Altered sleeping - - -  Tired, decreased energy - - -  Change in appetite - - -  Feeling bad or failure about yourself  - - -  Trouble concentrating - - -  Moving slowly or fidgety/restless - - -  Suicidal thoughts - - -  PHQ-9 Score - - -  Difficult doing work/chores - - -    Fall Risk  09/01/2019 03/06/2019 07/12/2018 04/26/2018 04/11/2018  Falls in the past year? 0 0 0 No No  Number falls in past yr: 0 0 - - -  Injury with Fall? 0 0 - - -  Follow up Falls evaluation completed - - - -     No Known Allergies  Prior to Admission medications   Medication Sig Start Date End Date Taking? Authorizing Provider  bismuth subsalicylate (PEPTO BISMOL) 262 MG chewable tablet Chew 524 mg by mouth as needed for indigestion.   Yes [provider]  DULoxetine (CYMBALTA) 30 MG capsule TAKE (1) CAPSULE  BY MOUTH EVERY DAY. 08/25/19  Yes Rutherford Guys, MD  ibuprofen (ADVIL,MOTRIN) 200 MG tablet Take 200-400 mg by mouth daily as needed for headache or moderate pain.   Yes [provider]  norethindrone-ethinyl estradiol (MICROGESTIN,JUNEL,LOESTRIN) 1-20 MG-MCG tablet TAKE (1) TABLET BY MOUTH ONCE DAILY. Patient taking differently: Take 1 tablet by mouth at bedtime.  08/19/17  Yes [place], Modena Nunnery, MD    Past Medical History:  Diagnosis Date  . ADD (attention deficit disorder)   . Anxiety   . Family history of adverse reaction to anesthesia    MOTHER HAD PONV  . Ovarian cyst, follicular 0/08/7492  . Pelvic pain in female 10/14/2014    Past Surgical History:  Procedure Laterality Date  . COLONOSCOPY N/A 07/04/2018   Procedure: COLONOSCOPY;  Surgeon: Danie Binder, MD;  [facility];  Service: Endoscopy;  Laterality: N/A;  2:00pm  . LAPAROSCOPY N/A 10/15/2014   Procedure: LAPAROSCOPY OPERATIVE;  Surgeon: Janyth Contes, MD;  Location: [facility];  Service: Gynecology;  Laterality: N/A;  . OVARIAN CYST REMOVAL Right 10/15/2014   Procedure: OVARIAN CYSTECTOMY;  Surgeon: Janyth Contes, MD;  Location: [facility];  Service: Gynecology;  Laterality: Right;  Dr only needs 1hr OR time  . WISDOM TOOTH EXTRACTION      Social History   Tobacco  Use  . Smoking status: Never Smoker  . Smokeless tobacco: Never Used  Substance Use Topics  . Alcohol use: Yes    Comment: occasionally    Family History  Problem Relation Age of Onset  . Healthy Mother   . Cancer Mother 1       Breast Cancer  . Healthy Father   . COPD Maternal Grandfather   . Heart attack Paternal Grandfather   . Colon cancer Neg Hx   . Colon polyps Neg Hx     ROS Per hpi  OBJECTIVE:  Today's Vitals   09/01/19 1019  BP: 109/78  Pulse: 88  Temp: 98.6 F (37 C)  TempSrc: Temporal  SpO2: 95%  Weight: 163 lb 3.2 oz (74 kg)  Height: 5\' 3"  (1.6 m)   Body mass index is 28.91  kg/m.   Physical Exam Vitals and nursing note reviewed. Exam conducted with a chaperone present.  Constitutional:      Appearance: She is well-developed.  HENT:     Head: Normocephalic and atraumatic.  Eyes:     General: No scleral icterus.    Conjunctiva/sclera: Conjunctivae normal.     Pupils: Pupils are equal, round, and reactive to light.  Pulmonary:     Effort: Pulmonary effort is normal.  Chest:     Breasts:        Right: No inverted nipple, mass, nipple discharge or skin change.        Left: No inverted nipple, mass, nipple discharge or skin change.  Musculoskeletal:     Cervical back: Neck supple.  Lymphadenopathy:     Upper Body:     Right upper body: No supraclavicular, axillary or pectoral adenopathy.     Left upper body: No supraclavicular, axillary or pectoral adenopathy.  Skin:    General: Skin is warm and dry.  Neurological:     Mental Status: She is alert and oriented to person, place, and time.     Results for orders placed or performed in visit on 09/01/19 (from the past 24 hour(s))  POCT urine pregnancy     Status: None   Collection Time: 09/01/19 10:19 AM  Result Value Ref Range   Preg Test, Ur Negative Negative    No results found.   ASSESSMENT and PLAN  1. Discharge from left nipple - POCT urine pregnancy - TSH - Prolactin - 10/30/19 BREAST LTD UNI LEFT INC AXILLA; Future  2. Need for prophylactic vaccination with combined diphtheria-tetanus-pertussis (DTP) vaccine - Tdap vaccine greater than or equal to 7yo IM  Return for after [place].    [place], MD Primary Care at [facility] [address] [place], [place] Ph.  [phone] Fax [phone]

## 2019-09-01 NOTE — Patient Instructions (Signed)
° ° ° °  If you have lab work done today you will be contacted with your lab results within the next 2 weeks.  If you have not heard from us then please contact us. The fastest way to get your results is to register for My Chart. ° ° °IF you received an x-ray today, you will receive an invoice from [facility]. Please contact [facility] at [phone] with questions or concerns regarding your invoice.  ° °IF you received labwork today, you will receive an invoice from [facility]. Please contact [facility] at [phone] with questions or concerns regarding your invoice.  ° °Our billing staff will not be able to assist you with questions regarding bills from these companies. ° °You will be contacted with the lab results as soon as they are available. The fastest way to get your results is to activate your My Chart account. Instructions are located on the last page of this paperwork. If you have not heard from us regarding the results in 2 weeks, please contact this office. °  ° ° ° °

## 2019-09-02 LAB — PROLACTIN: Prolactin: 26.6 ng/mL — ABNORMAL HIGH (ref 4.8–23.3)

## 2019-09-02 LAB — TSH: TSH: 0.863 u[IU]/mL (ref 0.450–4.500)

## 2019-09-07 NOTE — Addendum Note (Signed)
Addended by: Myles Lipps on: 09/07/2019 01:37 PM   Modules accepted: Orders

## 2019-09-18 ENCOUNTER — Ambulatory Visit
Admission: RE | Admit: 2019-09-18 | Discharge: 2019-09-18 | Disposition: A | Discharge status: Home / Self Care | Payer: Managed Care, Other (non HMO) | Source: Ambulatory Visit | Attending: Family Medicine | Admitting: Family Medicine

## 2019-09-18 ENCOUNTER — Other Ambulatory Visit: Payer: Self-pay

## 2019-09-18 DIAGNOSIS — N6452 Nipple discharge: Secondary | ICD-10-CM

## 2019-10-03 ENCOUNTER — Other Ambulatory Visit: Payer: Self-pay | Admitting: Family Medicine

## 2019-10-03 NOTE — Telephone Encounter (Signed)
Requested Prescriptions  Pending Prescriptions Disp Refills  . DULoxetine (CYMBALTA) 30 MG capsule [Pharmacy Med Name: DULOXETINE HCL DR 30 MG CAP] 30 capsule 0    Sig: TAKE (1) CAPSULE BY MOUTH EVERY DAY.     Psychiatry: Antidepressants - SNRI Passed - 10/03/2019 10:47 AM      Passed - Last BP in normal range    BP Readings from Last 1 Encounters:  09/01/19 109/78         Passed - Valid encounter within last 6 months    Recent Outpatient Visits          1 month ago Discharge from left nipple   [facility], Meda Coffee, MD   7 months ago Generalized anxiety disorder   [facility], Meda Coffee, MD   1 year ago Generalized anxiety disorder   [facility], Meda Coffee, MD   1 year ago Generalized anxiety disorder   [facility], [place], [place]

## 2019-10-28 ENCOUNTER — Other Ambulatory Visit: Payer: Self-pay | Admitting: Family Medicine

## 2020-04-04 ENCOUNTER — Other Ambulatory Visit: Payer: Self-pay | Admitting: Family Medicine

## 2020-04-04 NOTE — Telephone Encounter (Signed)
No further refills without office visit 

## 2020-04-04 NOTE — Telephone Encounter (Signed)
Please schedule f/u visit for med refills

## 2020-04-12 NOTE — Telephone Encounter (Signed)
04/12/2020 - PATIENT REQUESTING A REFILL ON HER DULOXETINE 30 mg. SHE HAS AN APPOINTMENT SCHEDULED FOR Thursday 04/14/2020 AT 2:40 pm WITH DR. Darcel Bayley. HER REQUEST HAS BEEN APPROVED PER FELICIA KIRBY. I DO NOT NEED TO ROUTE (DONE). MBC

## 2020-04-14 ENCOUNTER — Other Ambulatory Visit: Payer: Self-pay

## 2020-04-14 ENCOUNTER — Ambulatory Visit (INDEPENDENT_AMBULATORY_CARE_PROVIDER_SITE_OTHER): Payer: Managed Care, Other (non HMO) | Admitting: Family Medicine

## 2020-04-14 VITALS — BP 108/74 | HR 92 | Temp 98.6°F | Resp 15 | Ht 63.0 in | Wt 171.8 lb

## 2020-04-14 DIAGNOSIS — F411 Generalized anxiety disorder: Secondary | ICD-10-CM | POA: Diagnosis not present

## 2020-04-14 MED ORDER — DULOXETINE HCL 60 MG PO CPEP
60.0000 mg | ORAL_CAPSULE | Freq: Every day | ORAL | 1 refills | Status: DC
Start: 2020-04-14 — End: 2020-07-11

## 2020-04-14 MED ORDER — QUETIAPINE FUMARATE 25 MG PO TABS: 12.5000 mg | ORAL_TABLET | Freq: Every evening | ORAL | 2 refills | Status: DC | PRN

## 2020-04-14 MED ORDER — DULOXETINE HCL 60 MG PO CPEP
60.0000 mg | ORAL_CAPSULE | Freq: Every day | ORAL | 5 refills | Status: DC
Start: 2020-04-14 — End: 2020-04-14

## 2020-04-14 NOTE — Patient Instructions (Signed)
° ° ° °  If you have lab work done today you will be contacted with your lab results within the next 2 weeks.  If you have not heard from us then please contact us. The fastest way to get your results is to register for My Chart. ° ° °IF you received an x-ray today, you will receive an invoice from [facility]. Please contact [facility] at [phone] with questions or concerns regarding your invoice.  ° °IF you received labwork today, you will receive an invoice from [facility]. Please contact [facility] at [phone] with questions or concerns regarding your invoice.  ° °Our billing staff will not be able to assist you with questions regarding bills from these companies. ° °You will be contacted with the lab results as soon as they are available. The fastest way to get your results is to activate your My Chart account. Instructions are located on the last page of this paperwork. If you have not heard from us regarding the results in 2 weeks, please contact this office. °  ° ° ° °

## 2020-04-14 NOTE — Progress Notes (Signed)
8/19/20213:00 PM  Anne Underwood Anne Underwood 1996/04/10, 24 y.o., female [id number]  Chief Complaint  Patient presents with  . Anxiety    pt here for refill and possible increase on her anti anxiety medication as she has noticed more frequent breakthrough symptoms, pt notices higher anxiety at these points without the ability to control them GAD-7 =13    HPI:   Patient is a 24 y.o. female with past medical history significant for GAD, IBS ADD  who presents today for anxiety  Last OV jan 2021  Takes cymbalta 30mg  daily She is requesting increase as has noticed anxiety not so well controlled of recent She denies any sign life stressors Gad 7 noted   Depression screen [facility] 2/9 09/01/2019 03/06/2019 07/12/2018  Decreased Interest 0 0 0  Down, Depressed, Hopeless 0 0 0  PHQ - 2 Score 0 0 0  Altered sleeping - - -  Tired, decreased energy - - -  Change in appetite - - -  Feeling bad or failure about yourself  - - -  Trouble concentrating - - -  Moving slowly or fidgety/restless - - -  Suicidal thoughts - - -  PHQ-9 Score - - -  Difficult doing work/chores - - -    Fall Risk  04/14/2020 09/01/2019 03/06/2019 07/12/2018 04/26/2018  Falls in the past year? 0 0 0 0 No  Number falls in past yr: 0 0 0 - -  Injury with Fall? 0 0 0 - -  Risk for fall due to : No Fall Risks - - - -  Follow up Falls evaluation completed Falls evaluation completed - - -     No Known Allergies  Prior to Admission medications   Medication Sig Start Date End Date Taking? Authorizing Provider  bismuth subsalicylate (PEPTO BISMOL) 262 MG chewable tablet Chew 524 mg by mouth as needed for indigestion.   Yes [provider]  DULoxetine (CYMBALTA) 30 MG capsule TAKE (1) CAPSULE BY MOUTH EVERY DAY. 04/04/20  Yes 06/04/20, MD  ibuprofen (ADVIL,MOTRIN) 200 MG tablet Take 200-400 mg by mouth daily as needed for headache or moderate pain.   Yes [provider]  norethindrone-ethinyl estradiol  (MICROGESTIN,JUNEL,LOESTRIN) 1-20 MG-MCG tablet TAKE (1) TABLET BY MOUTH ONCE DAILY. Patient taking differently: Take 1 tablet by mouth at bedtime.  08/19/17  Yes [place], 08/21/17, MD    Past Medical History:  Diagnosis Date  . ADD (attention deficit disorder)   . Anxiety   . Family history of adverse reaction to anesthesia    MOTHER HAD PONV  . Ovarian cyst, follicular 10/14/2014  . Pelvic pain in female 10/14/2014    Past Surgical History:  Procedure Laterality Date  . COLONOSCOPY N/A 07/04/2018   Procedure: COLONOSCOPY;  Surgeon: 13/03/2018, MD;  Location: [facility];  Service: Endoscopy;  Laterality: N/A;  2:00pm  . LAPAROSCOPY N/A 10/15/2014   Procedure: LAPAROSCOPY OPERATIVE;  Surgeon: 10/17/2014, MD;  Location: [facility];  Service: Gynecology;  Laterality: N/A;  . OVARIAN CYST REMOVAL Right 10/15/2014   Procedure: OVARIAN CYSTECTOMY;  Surgeon: 10/17/2014, MD;  Location: [facility];  Service: Gynecology;  Laterality: Right;  Dr only needs 1hr OR time  . WISDOM TOOTH EXTRACTION      Social History   Tobacco Use  . Smoking status: Never Smoker  . Smokeless tobacco: Never Used  Substance Use Topics  . Alcohol use: Yes    Comment: occasionally    Family History  Problem Relation Age of Onset  . Healthy Mother   . Cancer Mother 56       Breast Cancer  . Healthy Father   . COPD Maternal Grandfather   . Heart attack Paternal Grandfather   . Colon cancer Neg Hx   . Colon polyps Neg Hx     ROS Per hpi  OBJECTIVE:  Today's Vitals   04/14/20 1453  BP: 108/74  Pulse: 92  Resp: 15  Temp: 98.6 F (37 C)  TempSrc: Temporal  SpO2: 96%  Weight: 171 lb 12.8 oz (77.9 kg)  Height: 5\' 3"  (1.6 m)   Body mass index is 30.43 kg/m.   Physical Exam Vitals and nursing note reviewed.  Constitutional:      Appearance: She is well-developed.  HENT:     Head: Normocephalic and atraumatic.  Eyes:     General: No scleral icterus.     Conjunctiva/sclera: Conjunctivae normal.     Pupils: Pupils are equal, round, and reactive to light.  Pulmonary:     Effort: Pulmonary effort is normal.  Musculoskeletal:     Cervical back: Neck supple.  Skin:    General: Skin is warm and dry.  Neurological:     Mental Status: She is alert and oriented to person, place, and time.      No results found for this or any previous visit (from the past 24 hour(s)).  No results found.   ASSESSMENT and PLAN  1. Generalized anxiety disorder Not controlled. Increasing duloxetine. Consider counseling.  Other orders - DULoxetine (CYMBALTA) 60 MG capsule; Take 1 capsule (60 mg total) by mouth daily.  Return in about 3 months (around 07/15/2020) for office or virtual ok.    07/17/2020, MD Primary [facility] at [facility] [address] [place], [place] Ph.  [phone] Fax [phone]

## 2020-04-15 ENCOUNTER — Encounter: Payer: Self-pay | Admitting: Family Medicine

## 2020-06-16 ENCOUNTER — Other Ambulatory Visit: Payer: Self-pay

## 2020-07-11 ENCOUNTER — Other Ambulatory Visit: Payer: Self-pay | Admitting: Family Medicine

## 2020-07-11 MED ORDER — DULOXETINE HCL 60 MG PO CPEP: 60.0000 mg | ORAL_CAPSULE | Freq: Every day | ORAL | 0 refills | Status: DC

## 2020-07-11 NOTE — Telephone Encounter (Signed)
Medication Refill - Medication: DULoxetine (CYMBALTA) 60 MG capsule     Preferred Pharmacy (with phone number or street name):  [facility] - [place], [place] - [address] Phone:  [phone]  Fax:  [phone]       Agent: Please be advised that RX refills may take up to 3 business days. We ask that you follow-up with your pharmacy.

## 2020-07-13 ENCOUNTER — Telehealth: Payer: Self-pay

## 2020-07-13 NOTE — Telephone Encounter (Signed)
Copied from CRM [id number]. Topic: General - Other >> Jul 13, 2020 12:04 PM Gwenlyn Fudge wrote: Reason for CRM: Pt called stating that she tested positive for covid and wanted to make provider aware. Please advise.

## 2020-07-14 NOTE — Telephone Encounter (Signed)
Sent pt message via mychart

## 2020-07-15 ENCOUNTER — Encounter: Payer: Managed Care, Other (non HMO) | Admitting: Registered Nurse

## 2020-08-12 IMAGING — US US BREAST*L* LIMITED INC AXILLA
1 series · 2 of 2 positions shown · non-contrast
Comparison: Previous exam(s).
COMPARISON: Previous exam(s).

Addendum:
CLINICAL DATA: 23-year-old female presenting for evaluation of the
left breast. The patient had noticed a spot on her left nipple. With
manual expression thick white discharge was elicited. The discharge
not occurred again, and the spot on her nipple has resolved. The
patient has family history of breast cancer in her mother who was
diagnosed in her late 40s early 50s. Her grandmother may also have
had breast cancer and she says multiple of her mother sisters have
history of ovarian cancer.

EXAM:
ULTRASOUND OF THE LEFT BREAST

[Series 1: us breast*left* limited inc axilla · 0.06mm/px · 2 of 2 slices shown]
[im 1/2]
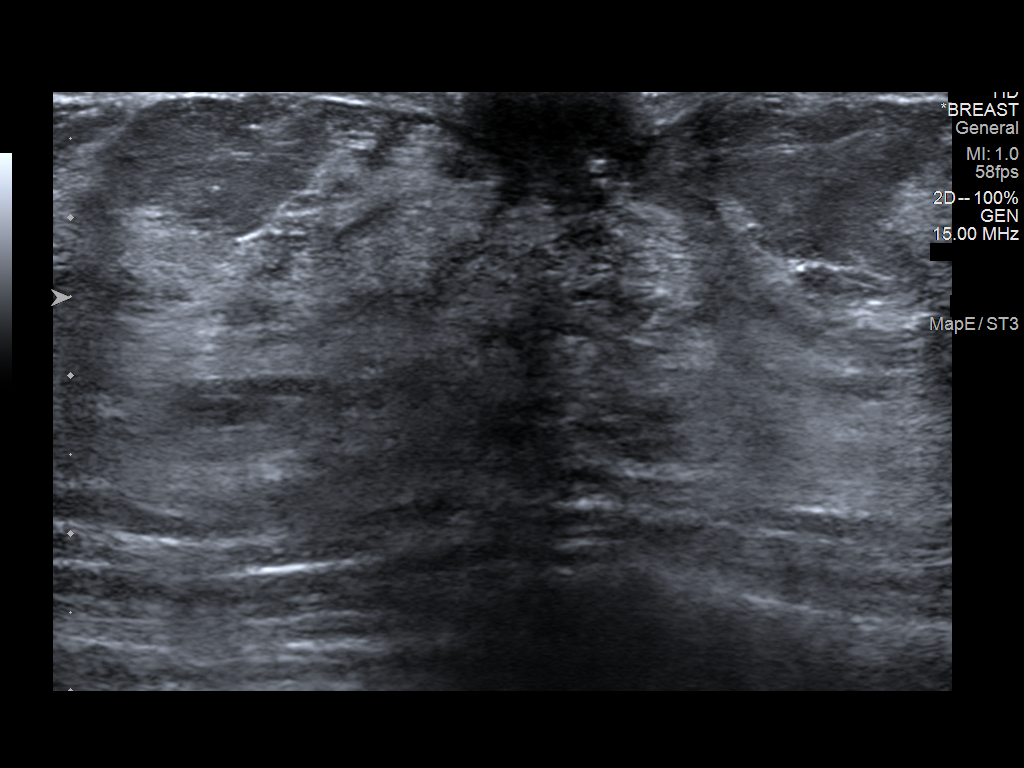
[im 2/2]
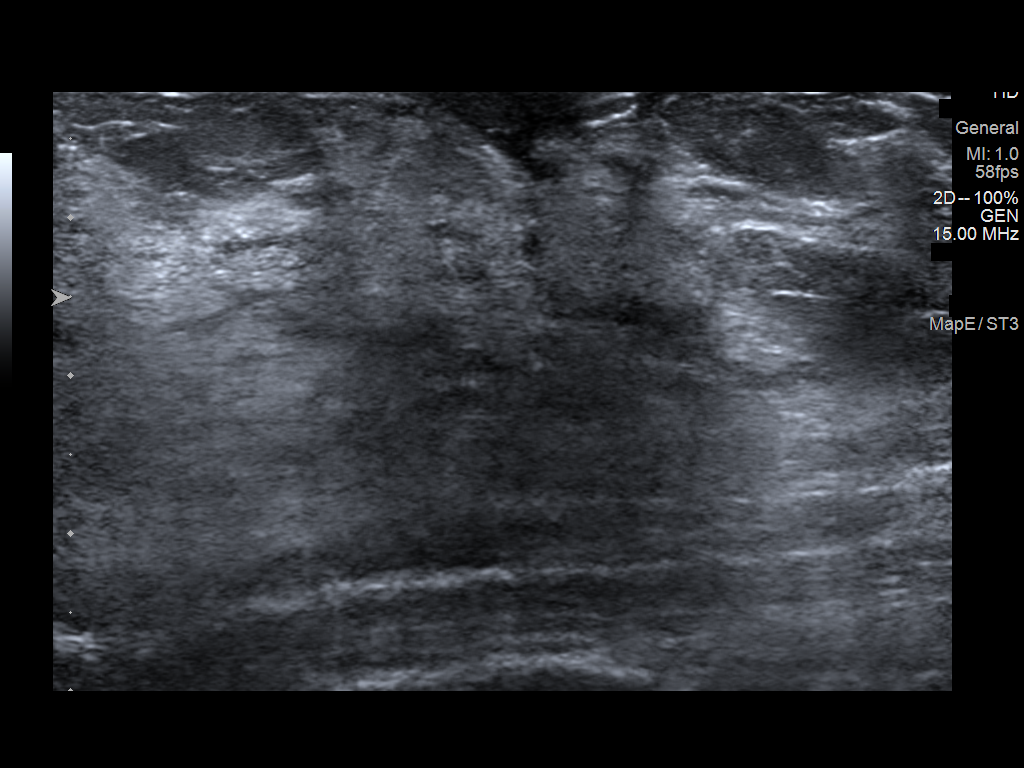

[2 of 2 positions shown; findings below may reference images not displayed]

FINDINGS: Ultrasound of the retroareolar left breast demonstrates normal
fibroglandular tissue. No suspicious masses or areas of shadowing
are identified.
IMPRESSION: Normal targeted retroareolar ultrasound of the left breast.

RECOMMENDATION:
1. Further management of nipple discharge should be based on
clinical assessment. As this has occurred only once and the issue
seems to have resolved, and there are no imaging findings, this was
likely a benign process.

2. Genetics assessment to determine the patient's lifetime risk of
breast cancer given her family history of breast and ovarian cancer
is recommended. Per American Cancer Society guidelines, if the
patient has a calculated lifetime risk of developing breast cancer
of greater than 20%, annual screening MRI of the breasts would be
recommended at the time of screening mammography.

I have discussed the findings and recommendations with the patient.
If applicable, a reminder letter will be sent to the patient
regarding the next appointment.

BI-RADS CATEGORY  1: Negative.

ADDENDUM:
Correction to the comparison exams: the patient has no prior breast
ultrasounds.

*** End of Addendum ***
FINDINGS: Ultrasound of the retroareolar left breast demonstrates normal
fibroglandular tissue. No suspicious masses or areas of shadowing
are identified.
IMPRESSION: Normal targeted retroareolar ultrasound of the left breast.

RECOMMENDATION:
1. Further management of nipple discharge should be based on
clinical assessment. As this has occurred only once and the issue
seems to have resolved, and there are no imaging findings, this was
likely a benign process.

2. Genetics assessment to determine the patient's lifetime risk of
breast cancer given her family history of breast and ovarian cancer
is recommended. Per American Cancer Society guidelines, if the
patient has a calculated lifetime risk of developing breast cancer
of greater than 20%, annual screening MRI of the breasts would be
recommended at the time of screening mammography.

I have discussed the findings and recommendations with the patient.
If applicable, a reminder letter will be sent to the patient
regarding the next appointment.

BI-RADS CATEGORY  1: Negative.

## 2020-09-26 ENCOUNTER — Telehealth: Payer: Managed Care, Other (non HMO) | Admitting: Family Medicine

## 2020-09-26 DIAGNOSIS — Z8709 Personal history of other diseases of the respiratory system: Secondary | ICD-10-CM | POA: Diagnosis not present

## 2020-09-26 DIAGNOSIS — R053 Chronic cough: Secondary | ICD-10-CM

## 2020-09-26 DIAGNOSIS — R0600 Dyspnea, unspecified: Secondary | ICD-10-CM

## 2020-09-26 DIAGNOSIS — J209 Acute bronchitis, unspecified: Secondary | ICD-10-CM | POA: Diagnosis not present

## 2020-09-26 DIAGNOSIS — R0609 Other forms of dyspnea: Secondary | ICD-10-CM

## 2020-09-26 MED ORDER — ALBUTEROL SULFATE HFA 108 (90 BASE) MCG/ACT IN AERS: 2.0000 | INHALATION_SPRAY | RESPIRATORY_TRACT | 0 refills | Status: DC | PRN

## 2020-09-26 MED ORDER — BENZONATATE 100 MG PO CAPS: 100.0000 mg | ORAL_CAPSULE | Freq: Three times a day (TID) | ORAL | 0 refills | Status: DC | PRN

## 2020-09-26 NOTE — Patient Instructions (Addendum)
Care plan: Albuterol inhaler 2 puffs every 4 hours as needed for persistent coughing, shortness of breath, and/or wheezing.  Also, Tessalon Perles 100 mg every 8 hours as needed as cough suppressant.  Increase fluid intake to around 32 ounces per day.  Recommend that patient read tests for Covid within the next couple of days.  Continue wearing a mask and social distancing.  For any other questions concerning COVID-19, will defer to CDC guidelines.  Thank you for allowing [place] to be a part of your care  Benzonatate capsules What is this medicine? BENZONATATE (ben ZOE na tate) is used to treat cough. This medicine may be used for other purposes; ask your health care provider or pharmacist if you have questions. COMMON BRAND NAME(S): Tessalon Perles, Zonatuss What should I tell my health care provider before I take this medicine? They need to know if you have any of these conditions:  kidney or liver disease  an unusual or allergic reaction to benzonatate, anesthetics, other medicines, foods, dyes, or preservatives  pregnant or trying to get pregnant  breast-feeding How should I use this medicine? Take this medicine by mouth with a glass of water. Follow the directions on the prescription label. Avoid breaking, chewing, or sucking the capsule, as this can cause serious side effects. Take your medicine at regular intervals. Do not take your medicine more often than directed. Talk to your pediatrician regarding the use of this medicine in children. While this drug may be prescribed for children as young as 26 years old for selected conditions, precautions do apply. Overdosage: If you think you have taken too much of this medicine contact a poison control center or emergency room at once. NOTE: This medicine is only for you. Do not share this medicine with others. What if I miss a dose? If you miss a dose, take it as soon as you can. If it is almost time for your next dose, take only that dose. Do  not take double or extra doses. What may interact with this medicine? Do not take this medicine with any of the following medications:  MAOIs like Carbex, Eldepryl, Marplan, Nardil, and Parnate This list may not describe all possible interactions. Give your health care provider a list of all the medicines, herbs, non-prescription drugs, or dietary supplements you use. Also tell them if you smoke, drink alcohol, or use illegal drugs. Some items may interact with your medicine. What should I watch for while using this medicine? Tell your doctor if your symptoms do not improve or if they get worse. If you have a high fever, skin rash, or headache, see your health care professional. You may get drowsy or dizzy. Do not drive, use machinery, or do anything that needs mental alertness until you know how this medicine affects you. Do not sit or stand up quickly, especially if you are an older patient. This reduces the risk of dizzy or fainting spells. What side effects may I notice from receiving this medicine? Side effects that you should report to your doctor or health care professional as soon as possible:  allergic reactions like skin rash, itching or hives, swelling of the face, lips, or tongue  breathing problems  chest pain  confusion or hallucinations  irregular heartbeat  numbness of mouth or throat  seizures Side effects that usually do not require medical attention (report to your doctor or health care professional if they continue or are bothersome):  burning feeling in the eyes  constipation  headache  nasal congestion  stomach upset This list may not describe all possible side effects. Call your doctor for medical advice about side effects. You may report side effects to FDA at 1-800-FDA-1088. Where should I keep my medicine? Keep out of the reach of children. Store at room temperature between 15 and 30 degrees C (59 and 86 degrees F). Keep tightly closed. Protect from  light and moisture. Throw away any unused medicine after the expiration date. NOTE: This sheet is a summary. It may not cover all possible information. If you have questions about this medicine, talk to your doctor, pharmacist, or health care provider.  2021 Elsevier/Gold Standard (2007-11-12 14:52:56) Albuterol Metered Dose Inhaler (MDI) What is this medicine? ALBUTEROL (al Gaspar Bidding) is a bronchodilator. It treats bronchospasm. Bronchospasm is when you have trouble breathing and make loud or whistling sounds when you breathe. This drug opens the airways in the lungs so it is easier to breathe. This medicine may be used for other purposes; ask your health care provider or pharmacist if you have questions. COMMON BRAND NAME(S): Proair HFA, Proventil, Proventil HFA, Respirol, Ventolin, Ventolin HFA What should I tell my health care provider before I take this medicine? They need to know if you have any of the following conditions:  diabetes (high blood sugar)  heart disease  high blood pressure  irregular heartbeat or rhythm  pheochromocytoma  seizures  thyroid disease  an unusual or allergic reaction to albuterol, levalbuterol, other medicines, foods, dyes, or preservatives  pregnant or trying to get pregnant  breast-feeding How should I use this medicine? This medicine is inhaled through the mouth. Take it as directed on the prescription label. Do not use it more often than directed. This medicine comes with INSTRUCTIONS FOR USE. Ask your pharmacist for directions on how to use this medicine. Read the information carefully. Talk to your pharmacist or health care provider if you have questions. Talk to your health care provider about the use of this medicine in children. While it may be given to children for selected conditions, precautions do apply. Overdosage: If you think you have taken too much of this medicine contact a poison control center or emergency room at  once. NOTE: This medicine is only for you. Do not share this medicine with others. What if I miss a dose? If you take this medication on a regular basis, take it as soon as you can. If it is almost time for your next dose, take only that dose. Do not take double or extra doses. What may interact with this medicine?  anti-infectives like chloroquine and pentamidine  caffeine  cisapride  diuretics  medicines for colds  medicines for depression or for emotional or psychotic conditions  medicines for weight loss including some herbal products  methadone  some antibiotics like clarithromycin, erythromycin, levofloxacin, and linezolid  some heart medicines  steroid hormones like dexamethasone, cortisone, hydrocortisone  theophylline  thyroid hormones This list may not describe all possible interactions. Give your health care provider a list of all the medicines, herbs, non-prescription drugs, or dietary supplements you use. Also tell them if you smoke, drink alcohol, or use illegal drugs. Some items may interact with your medicine. What should I watch for while using this medicine? Visit your health care provider for regular checks on your progress. Tell your health care provider if your symptoms do not start to get better or if they get worse. If your symptoms get worse or if you are using this  drug more than normal, call your health care provider right away. Do not treat yourself for coughs, colds or allergies without asking your health care provider for advice. Some nonprescription medicines can affect this one. You and your health care provider should develop an Asthma Action Plan that is just for you. Be sure to know what to do if you are in the yellow (asthma is getting worse) or red (medical alert) zones. Your mouth may get dry. Chewing sugarless gum or sucking hard candy and drinking plenty of water may help. Contact your health care provider if the problem does not go away or  is severe. What side effects may I notice from receiving this medicine? Side effects that you should report to your doctor or health care professional as soon as possible:  allergic reactions (skin rash, itching or hives; swelling of the face, lips, or tongue)  fever  heartbeat rhythm changes (trouble breathing; chest pain; dizziness; fast, irregular heartbeat; feeling faint or lightheaded, falls)  increase in blood pressure  muscle cramps, pain  muscle weakness  pain, tingling, numbness in the hands or feet  vomiting Side effects that usually do not require medical attention (report to your doctor or health care professional if they continue or are bothersome):  change in taste  cough  dry mouth  headache  nasal congestion (runny or stuffy nose)  sore throat  tremors  trouble sleeping  upset stomach This list may not describe all possible side effects. Call your doctor for medical advice about side effects. You may report side effects to FDA at 1-800-FDA-1088. Where should I keep my medicine? Keep out of the reach of children and pets. Store at room temperature between 20 and 25 degrees C (68 and 77 degrees F). Keep inhaler away from extreme heat and cold. Get rid of it when the dose counter reads 0 or after the expiration date, whichever is first. NOTE: This sheet is a summary. It may not cover all possible information. If you have questions about this medicine, talk to your doctor, pharmacist, or health care provider.  2021 Elsevier/Gold Standard (2020-07-29 10:51:44)

## 2020-09-26 NOTE — Progress Notes (Signed)
Ms. Anne, Underwood are scheduled for a virtual visit with your provider today.    Just as we do with appointments in the office, we must obtain your consent to participate.  Your consent will be active for this visit and any virtual visit you may have with one of our providers in the next 365 days.    If you have a MyChart account, I can also send a copy of this consent to you electronically.  All virtual visits are billed to your insurance company just like a traditional visit in the office.  As this is a virtual visit, video technology does not allow for your provider to perform a traditional examination.  This may limit your provider's ability to fully assess your condition.  If your provider identifies any concerns that need to be evaluated in person or the need to arrange testing such as labs, EKG, etc, we will make arrangements to do so.    Although advances in technology are sophisticated, we cannot ensure that it will always work on either your end or our end.  If the connection with a video visit is poor, we may have to switch to a telephone visit.  With either a video or telephone visit, we are not always able to ensure that we have a secure connection.   I need to obtain your verbal consent now.   Are you willing to proceed with your visit today?   [name] has provided verbal consent on 09/26/2020 for a virtual visit (video or telephone).  Virtual Visit via Video Note  I connected with Lucas Mallow on 09/26/20 at 11:30 AM EST by a video enabled telemedicine application and verified that I am speaking with the correct person using two identifiers.  Location: Patient: Work Provider: Home   I discussed the limitations of evaluation and management by telemedicine and the availability of in person appointments. The patient expressed understanding and agreed to proceed.  History of Present Illness:   Anne Underwood is a 25 year old female that presents via video with complaints of persistent  cough and shortness of breath over the past week.  Patient states that she has been tested for Covid 5 times per PCR which have all been negative.  Of note, her coworkers have all tested positive for Covid and patient works in a nursing home.  Patient states that she last tested positive for COVID-19 in November 2021, since that time she has experienced periodic shortness of breath.  Patient endorses persistent cough.  She characterizes cough as intermittent and dry and is worsened by lying down.  Patient has not attempted any over-the-counter interventions to alleviate this problem.  Patient has a history of asthma but has not had any exacerbations since childhood.  Cough This is a recurrent problem. The current episode started 1 to 4 weeks ago. The problem has been waxing and waning. The cough is non-productive. Associated symptoms include shortness of breath. Pertinent negatives include no chest pain, chills, ear congestion, ear pain, fever, headaches, heartburn, nasal congestion, postnasal drip, rash, rhinorrhea, sweats, weight loss or wheezing.   Review of Systems  Constitutional: Negative for chills, fever and weight loss.  HENT: Negative for ear pain, postnasal drip and rhinorrhea.   Eyes: Negative.   Respiratory: Positive for cough and shortness of breath. Negative for wheezing.   Cardiovascular: Negative for chest pain.  Gastrointestinal: Negative for heartburn.  Genitourinary: Negative.   Musculoskeletal: Negative.   Skin: Negative for rash.  Neurological: Negative  for headaches.  Psychiatric/Behavioral: Negative.    Past Medical History:  Diagnosis Date  . ADD (attention deficit disorder)   . Anxiety   . Family history of adverse reaction to anesthesia    MOTHER HAD PONV  . Ovarian cyst, follicular 10/14/2014  . Pelvic pain in female 10/14/2014   Social History   Socioeconomic History  . Marital status: Single    Spouse name: Not on file  . Number of children: 0  . Years of  education: Not on file  . Highest education level: Not on file  Occupational History  . Not on file  Tobacco Use  . Smoking status: Never Smoker  . Smokeless tobacco: Never Used  Vaping Use  . Vaping Use: Never used  Substance and Sexual Activity  . Alcohol use: Yes    Comment: occasionally  . Drug use: No  . Sexual activity: Yes    Birth control/protection: Pill  Other Topics Concern  . Not on file  Social History Narrative  . Not on file   Social Determinants of Health   Financial Resource Strain: Not on file  Food Insecurity: Not on file  Transportation Needs: Not on file  Physical Activity: Not on file  Stress: Not on file  Social Connections: Not on file  Intimate Partner Violence: Not on file   Immunization History  Administered Date(s) Administered  . Hep A / Hep B 02/17/2013  . Influenza-Unspecified 06/29/2019, 03/31/2020  . Meningococcal Conjugate 02/17/2013  . PPD Test 02/08/2015, 02/12/2017  . Td 04/04/2006  . Tdap 08/31/2019, 09/01/2019   No Known Allergies    Assessment and Plan: 1. History of asthma Patient has a history of asthma, but has not had any exacerbations since childhood.  Patient is familiar with metered dose inhaler use.  2. Acute bronchitis with symptoms > 10 days - albuterol (VENTOLIN HFA) 108 (90 Base) MCG/ACT inhaler; Inhale 2 puffs into the lungs every 4 (four) hours as needed for wheezing or shortness of breath.  Dispense: 8 g; Refill: 0 - benzonatate (TESSALON) 100 MG capsule; Take 1 capsule (100 mg total) by mouth 3 (three) times daily as needed for cough.  Dispense: 30 capsule; Refill: 0  3. Persistent cough - albuterol (VENTOLIN HFA) 108 (90 Base) MCG/ACT inhaler; Inhale 2 puffs into the lungs every 4 (four) hours as needed for wheezing or shortness of breath.  Dispense: 8 g; Refill: 0 - benzonatate (TESSALON) 100 MG capsule; Take 1 capsule (100 mg total) by mouth 3 (three) times daily as needed for cough.  Dispense: 30 capsule;  Refill: 0  4. Dyspnea on effort - albuterol (VENTOLIN HFA) 108 (90 Base) MCG/ACT inhaler; Inhale 2 puffs into the lungs every 4 (four) hours as needed for wheezing or shortness of breath.  Dispense: 8 g; Refill: 0  Follow Up Instructions:   Reminded to schedule follow-up with PCP, first available appointment. I discussed the assessment and treatment plan with the patient. The patient was provided an opportunity to ask questions and all were answered. The patient agreed with the plan and demonstrated an understanding of the instructions.   The patient was advised to call back or seek an in-person evaluation if the symptoms worsen or if the condition fails to improve as anticipated.  I provided 10 minutes of non-face-to-face time during this encounter.  Nolon Nations  APRN, MSN, FNP-C Patient Care [facility] [address] [place], [place] [address] [phone]   09/26/2020  11:41 AM

## 2020-10-10 ENCOUNTER — Encounter: Payer: Self-pay | Admitting: Registered Nurse

## 2020-10-10 ENCOUNTER — Telehealth (INDEPENDENT_AMBULATORY_CARE_PROVIDER_SITE_OTHER): Payer: Managed Care, Other (non HMO) | Admitting: Registered Nurse

## 2020-10-10 ENCOUNTER — Other Ambulatory Visit: Payer: Self-pay

## 2020-10-10 VITALS — Wt 174.0 lb

## 2020-10-10 DIAGNOSIS — F411 Generalized anxiety disorder: Secondary | ICD-10-CM

## 2020-10-10 MED ORDER — HYDROXYZINE HCL 10 MG PO TABS: 10.0000 mg | ORAL_TABLET | Freq: Three times a day (TID) | ORAL | 0 refills | Status: DC | PRN

## 2020-10-10 MED ORDER — DULOXETINE HCL 60 MG PO CPEP: 60.0000 mg | ORAL_CAPSULE | Freq: Every day | ORAL | 3 refills | Status: DC

## 2020-10-10 NOTE — Progress Notes (Signed)
Telemedicine Encounter- SOAP NOTE Established Patient  This telephone encounter was conducted with the patient's (or proxy's) verbal consent via audio telecommunications: yes  Patient was instructed to have this encounter in a suitably private space; and to only have persons present to whom they give permission to participate. In addition, patient identity was confirmed by use of name plus two identifiers (DOB and address).  I discussed the limitations, risks, security and privacy concerns of performing an evaluation and management service by telephone and the availability of in person appointments. I also discussed with the patient that there may be a patient responsible charge related to this service. The patient expressed understanding and agreed to proceed.  I spent a total of 11 minutes talking with the patient or their proxy.  Patient at secure, quiet place at work Provider in office   Chief Complaint  Patient presents with  . Medication Refill    Patient needs to get a medication refill and discuss getting a dosage change.     Subjective   Anne Underwood is a 25 y.o. established patient. Telephone visit today for anxiety  HPI Duloxetine 60mg  daily has worked out very well - overall anxiety in much better control Every few weeks having some breakthrough anxiety and what feels like a panic attack Is able to calm herself but hoping for better control with medication Has not been on PRN anxiolytics in the past.   Patient Active Problem List   Diagnosis Date Noted  . Generalized anxiety disorder 04/14/2020  . Rectal bleeding 05/14/2018  . Abdominal pain 05/14/2018  . Constipation 05/14/2018  . Pelvic pain in female 10/14/2014  . Ovarian cyst, follicular 10/14/2014  . ADD (attention deficit disorder) 07/02/2014  . Headache 12/12/2013  . Panic attacks 11/03/2013  . Palpitations 11/03/2013    Past Medical History:  Diagnosis Date  . ADD (attention deficit disorder)    . Anxiety   . Family history of adverse reaction to anesthesia    MOTHER HAD PONV  . Ovarian cyst, follicular 10/14/2014  . Pelvic pain in female 10/14/2014    Current Outpatient Medications  Medication Sig Dispense Refill  . albuterol (VENTOLIN HFA) 108 (90 Base) MCG/ACT inhaler Inhale 2 puffs into the lungs every 4 (four) hours as needed for wheezing or shortness of breath. 8 g 0  . bismuth subsalicylate (PEPTO BISMOL) 262 MG chewable tablet Chew 524 mg by mouth as needed for indigestion.    . hydrOXYzine (ATARAX/VISTARIL) 10 MG tablet Take 1 tablet (10 mg total) by mouth 3 (three) times daily as needed. 30 tablet 0  . ibuprofen (ADVIL,MOTRIN) 200 MG tablet Take 200-400 mg by mouth daily as needed for headache or moderate pain.    10/16/2014 norethindrone-ethinyl estradiol (MICROGESTIN,JUNEL,LOESTRIN) 1-20 MG-MCG tablet TAKE (1) TABLET BY MOUTH ONCE DAILY. 63 tablet 0  . DULoxetine (CYMBALTA) 60 MG capsule Take 1 capsule (60 mg total) by mouth daily. 90 capsule 3   No current facility-administered medications for this visit.    No Known Allergies  Social History   Socioeconomic History  . Marital status: Single    Spouse name: Not on file  . Number of children: 0  . Years of education: Not on file  . Highest education level: Not on file  Occupational History  . Not on file  Tobacco Use  . Smoking status: Never Smoker  . Smokeless tobacco: Never Used  Vaping Use  . Vaping Use: Never used  Substance and Sexual Activity  .  Alcohol use: Yes    Comment: occasionally  . Drug use: No  . Sexual activity: Yes    Birth control/protection: Pill  Other Topics Concern  . Not on file  Social History Narrative  . Not on file   Social Determinants of Health   Financial Resource Strain: Not on file  Food Insecurity: Not on file  Transportation Needs: Not on file  Physical Activity: Not on file  Stress: Not on file  Social Connections: Not on file  Intimate Partner Violence: Not on file     Review of Systems  Constitutional: Negative.   HENT: Negative.   Eyes: Negative.   Respiratory: Negative.   Cardiovascular: Negative.   Gastrointestinal: Negative.   Genitourinary: Negative.   Musculoskeletal: Negative.   Skin: Negative.   Neurological: Negative.   Endo/Heme/Allergies: Negative.   Psychiatric/Behavioral: The patient is nervous/anxious.     Objective   Vitals as reported by the patient: Today's Vitals   10/10/20 1702  Weight: 174 lb (78.9 kg)    Azra was seen today for medication refill.  Diagnoses and all orders for this visit:  Generalized anxiety disorder -     DULoxetine (CYMBALTA) 60 MG capsule; Take 1 capsule (60 mg total) by mouth daily. -     hydrOXYzine (ATARAX/VISTARIL) 10 MG tablet; Take 1 tablet (10 mg total) by mouth 3 (three) times daily as needed.   PLAN  Refill cymbalta  Will try hydroxyzine 10mg  PO tid PRN for breakthrough anxiety. Discussed r/b/se with patient who voiced understanding  Return prn  Patient encouraged to call clinic with any questions, comments, or concerns.   I discussed the assessment and treatment plan with the patient. The patient was provided an opportunity to ask questions and all were answered. The patient agreed with the plan and demonstrated an understanding of the instructions.   The patient was advised to call back or seek an in-person evaluation if the symptoms worsen or if the condition fails to improve as anticipated.  I provided 11 minutes of non-face-to-face time during this encounter.  , NP  Primary Care at [facility]

## 2020-10-10 NOTE — Patient Instructions (Signed)
° ° ° °  If you have lab work done today you will be contacted with your lab results within the next 2 weeks.  If you have not heard from us then please contact us. The fastest way to get your results is to register for My Chart. ° ° °IF you received an x-ray today, you will receive an invoice from [facility]. Please contact [facility] at [phone] with questions or concerns regarding your invoice.  ° °IF you received labwork today, you will receive an invoice from [facility]. Please contact [facility] at [phone] with questions or concerns regarding your invoice.  ° °Our billing staff will not be able to assist you with questions regarding bills from these companies. ° °You will be contacted with the lab results as soon as they are available. The fastest way to get your results is to activate your My Chart account. Instructions are located on the last page of this paperwork. If you have not heard from us regarding the results in 2 weeks, please contact this office. °  ° ° ° °

## 2020-11-28 LAB — OB RESULTS CONSOLE GC/CHLAMYDIA
Chlamydia: NEGATIVE
Gonorrhea: NEGATIVE

## 2020-11-28 LAB — OB RESULTS CONSOLE RUBELLA ANTIBODY, IGM: Rubella: IMMUNE

## 2020-11-28 LAB — OB RESULTS CONSOLE HEPATITIS B SURFACE ANTIGEN
Hepatitis B Surface Ag: NEGATIVE
Hepatitis B Surface Ag: UNDETERMINED

## 2020-11-28 LAB — OB RESULTS CONSOLE RPR: RPR: NONREACTIVE

## 2020-11-28 LAB — OB RESULTS CONSOLE HIV ANTIBODY (ROUTINE TESTING): HIV: NONREACTIVE

## 2020-11-28 LAB — HEPATITIS C ANTIBODY
HCV Ab: NEGATIVE
HCV Ab: UNDETERMINED

## 2020-12-01 ENCOUNTER — Ambulatory Visit (INDEPENDENT_AMBULATORY_CARE_PROVIDER_SITE_OTHER): Payer: Managed Care, Other (non HMO) | Admitting: Family Medicine

## 2020-12-01 ENCOUNTER — Encounter: Payer: Self-pay | Admitting: Family Medicine

## 2020-12-01 ENCOUNTER — Other Ambulatory Visit: Payer: Self-pay

## 2020-12-01 VITALS — BP 114/72 | HR 92 | Temp 98.3°F | Ht 63.0 in | Wt 173.0 lb

## 2020-12-01 DIAGNOSIS — Z7689 Persons encountering health services in other specified circumstances: Secondary | ICD-10-CM

## 2020-12-01 DIAGNOSIS — F411 Generalized anxiety disorder: Secondary | ICD-10-CM | POA: Diagnosis not present

## 2020-12-01 MED ORDER — DULOXETINE HCL 30 MG PO CPEP: 30.0000 mg | ORAL_CAPSULE | Freq: Every day | ORAL | 0 refills | Status: DC

## 2020-12-01 MED ORDER — DULOXETINE HCL 20 MG PO CPEP: 20.0000 mg | ORAL_CAPSULE | Freq: Every day | ORAL | 0 refills | Status: DC

## 2020-12-01 NOTE — Patient Instructions (Signed)
Take Cymbalta 30 mg daily x 2 weeks.  Then take Cymbalta 20 mg daily x 1 week.  Then take Cymbalta 20 mg every other day x 1 week. Then discontinue Cymbalta.

## 2020-12-01 NOTE — Progress Notes (Signed)
New Patient Office Visit  Subjective:  Patient ID: Anne Underwood, female    DOB: April 03, 1996  Age: 25 y.o. MRN: [id number]  CC:  Chief Complaint  Patient presents with  . New Patient (Initial Visit)    HPI Anne Underwood presents to establish care. She is currently 9 weeks present. She is being seen at [facility] care. She has been taking Cymbalta for about 1 year for anxiety. She would like to taper off this. She reports feeling terrible anytime she has missed a dose of the cymbalta. She does not want to take medication while pregnant. She is not interested in therapy at this time.   Past Medical History:  Diagnosis Date  . ADD (attention deficit disorder)   . Anxiety   . Family history of adverse reaction to anesthesia    MOTHER HAD PONV  . Ovarian cyst, follicular 10/14/2014  . Pelvic pain in female 10/14/2014    Past Surgical History:  Procedure Laterality Date  . COLONOSCOPY N/A 07/04/2018   Procedure: COLONOSCOPY;  Surgeon: West Bali, MD;  Location: [facility];  Service: Endoscopy;  Laterality: N/A;  2:00pm  . LAPAROSCOPY N/A 10/15/2014   Procedure: LAPAROSCOPY OPERATIVE;  Surgeon: Sherian Rein, MD;  Location: [facility] ORS;  Service: Gynecology;  Laterality: N/A;  . OVARIAN CYST REMOVAL Right 10/15/2014   Procedure: OVARIAN CYSTECTOMY;  Surgeon: Sherian Rein, MD;  Location: [facility] ORS;  Service: Gynecology;  Laterality: Right;  Dr only needs 1hr OR time  . WISDOM TOOTH EXTRACTION      Family History  Problem Relation Age of Onset  . Healthy Mother   . Cancer Mother 75       Breast Cancer  . Healthy Father   . COPD Maternal Grandfather   . Heart attack Paternal Grandfather   . Colon cancer Neg Hx   . Colon polyps Neg Hx     Social History   Socioeconomic History  . Marital status: Single    Spouse name: Not on file  . Number of children: 0  . Years of education: 16  . Highest education level: Bachelor's degree (e.g., BA, AB, BS)   Occupational History  . Occupation: Hospital doctor: [employer]  Tobacco Use  . Smoking status: Never Smoker  . Smokeless tobacco: Never Used  Vaping Use  . Vaping Use: Never used  Substance and Sexual Activity  . Alcohol use: Not Currently  . Drug use: No  . Sexual activity: Yes    Birth control/protection: None    Comment: Current Pregnancy  Other Topics Concern  . Not on file  Social History Narrative   [employer]   Social Determinants of Health   Financial Resource Strain: Not on file  Food Insecurity: Not on file  Transportation Needs: Not on file  Physical Activity: Not on file  Stress: Not on file  Social Connections: Not on file  Intimate Partner Violence: Not on file    ROS Review of Systems As per HPI.   Objective:   Today's Vitals: BP 114/72   Pulse 92   Temp 98.3 F (36.8 C) (Temporal)   Ht 5\' 3"  (1.6 m)   Wt 173 lb (78.5 kg)   BMI 30.65 kg/m   Physical Exam Vitals and nursing note reviewed.  Constitutional:      General: She is not in acute distress.    Appearance: Normal appearance. She is not ill-appearing, toxic-appearing or diaphoretic.  HENT:  Head: Normocephalic and atraumatic.  Cardiovascular:     Rate and Rhythm: Normal rate and regular rhythm.     Heart sounds: Normal heart sounds. No murmur heard.   Pulmonary:     Effort: Pulmonary effort is normal. No respiratory distress.     Breath sounds: Normal breath sounds.  Musculoskeletal:     Right lower leg: No edema.     Left lower leg: No edema.  Skin:    General: Skin is dry.  Neurological:     General: No focal deficit present.     Mental Status: She is alert and oriented to person, place, and time.  Psychiatric:        Mood and Affect: Mood normal.        Behavior: Behavior normal.     Assessment & Plan:   [name] was seen today for new patient (initial visit).  Diagnoses and all orders for this visit:  Generalized anxiety disorder Currently [redacted]  weeks pregnant and would like to taper off Cymbalta. Start Cymbalta 30 mg x 2 weeks, then Cymbalta 20 mg x 1 week, then Cymbalta 20 mg every other day x 1 week. Then discontinue. Declined psychology referral today. Follow up in 2 weeks, sooner if needed.  -     DULoxetine (CYMBALTA) 30 MG capsule; Take 1 capsule (30 mg total) by mouth daily for 14 days. -     DULoxetine (CYMBALTA) 20 MG capsule; Take 1 capsule (20 mg total) by mouth daily for 14 days.  Encounter to establish care Reviewed available records.    Follow-up: Return in about 2 weeks (around 12/15/2020) for phone or in person, for cymbalta taper.   The patient indicates understanding of these issues and agrees with the plan.   Gabriel Earing, FNP

## 2020-12-15 ENCOUNTER — Ambulatory Visit (INDEPENDENT_AMBULATORY_CARE_PROVIDER_SITE_OTHER): Payer: Managed Care, Other (non HMO) | Admitting: Family Medicine

## 2020-12-15 ENCOUNTER — Encounter: Payer: Self-pay | Admitting: Family Medicine

## 2020-12-15 DIAGNOSIS — F411 Generalized anxiety disorder: Secondary | ICD-10-CM

## 2020-12-15 NOTE — Progress Notes (Signed)
   Virtual Visit  Note Due to COVID-19 pandemic this visit was conducted virtually. This visit type was conducted due to national recommendations for restrictions regarding the COVID-19 Pandemic (e.g. social distancing, sheltering in place) in an effort to limit this patient's exposure and mitigate transmission in our community. All issues noted in this document were discussed and addressed.  A physical exam was not performed with this format.  I connected with Anne Underwood on 12/15/20 at 1434 by telephone and verified that I am speaking with the correct person using two identifiers. Anne Underwood is currently located in her car and no one is currently with her during the visit. The provider, Gabriel Earing, FNP is located in their office at time of visit.  I discussed the limitations, risks, security and privacy concerns of performing an evaluation and management service by telephone and the availability of in person appointments. I also discussed with the patient that there may be a patient responsible charge related to this service. The patient expressed understanding and agreed to proceed.  CC: anxiety  History and Present Illness:  HPI Anne Underwood is in the process of working to taper off of Cymbalta while she is pregnant. She decreased to 60 mg daily to 30 mg daily for the last two weeks. She will taper down to 20 mg starting tomorrow. She reports that she has been doing pretty well so far. She did noticed some dizziness today that occurs only with head movements. She does not want to try another anxiety medication at this point. She reports doing well with her anxiety while tapering off so far. She has established with an OB.    ROS As per HPI.    Observations/Objective: Alert and oriented x 3. Able to speak in full sentences without difficulty.   Assessment and Plan: Anne Underwood was seen today for anxiety.  Diagnoses and all orders for this visit:  Generalized anxiety disorder Tapering  off of Cymbalta while pregnant. She will start 20 mg of Cymbalta every other day for 2-3 weeks, followed by Cymbalta twice a week for 2 weeks, then discontinuing. She will reach out for any questions, concerns, or significant side effects. She will also reach out if her anxiety becomes uncontrolled.     Follow Up Instructions: As needed.     I discussed the assessment and treatment plan with the patient. The patient was provided an opportunity to ask questions and all were answered. The patient agreed with the plan and demonstrated an understanding of the instructions.   The patient was advised to call back or seek an in-person evaluation if the symptoms worsen or if the condition fails to improve as anticipated.  The above assessment and management plan was discussed with the patient. The patient verbalized understanding of and has agreed to the management plan. Patient is aware to call the clinic if symptoms persist or worsen. Patient is aware when to return to the clinic for a follow-up visit. Patient educated on when it is appropriate to go to the emergency department.   Time call ended:  1445  I provided 11 minutes of  non face-to-face time during this encounter.    Gabriel Earing, FNP

## 2020-12-29 ENCOUNTER — Other Ambulatory Visit: Payer: Self-pay | Admitting: Family Medicine

## 2020-12-29 DIAGNOSIS — F411 Generalized anxiety disorder: Secondary | ICD-10-CM

## 2021-04-10 LAB — OB RESULTS CONSOLE ABO/RH: RH Type: POSITIVE

## 2021-04-10 LAB — OB RESULTS CONSOLE RPR: RPR: NONREACTIVE

## 2021-05-05 ENCOUNTER — Telehealth: Payer: Self-pay

## 2021-05-05 NOTE — Telephone Encounter (Signed)
Referral notes received from [facility], Phone #: [phone], Fax #: [phone]   A copy of the notes have been placed in the scheduling box for check-out to pick-up and to enter referral. Original notes placed in file cabinet.

## 2021-05-09 NOTE — Progress Notes (Signed)
Cardiology Office Note:    Date:  05/10/2021   ID:  Anne Underwood, DOB 1995-12-09, MRN [id number]  PCP:  Gabriel Earing, FNP   [facility] Providers Cardiologist:  Christell Constant, MD     Referring MD: Toy Baker, DO   CC: Heart racing Consulted for the evaluation of tachycardia at the behest of Gabriel Earing, [place]  History of Present Illness:    Anne Underwood is a 25 y.o. female with a hx of palpitations in the setting of pregnancy seen 05/10/21.  Patient notes that she is feeling her heart racing all the time.  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  This occurs three times a week.  Has occurred with eating chocolate cover almonds.  Has has to sit down with near syncope at the hard ware story that was associated with palpitations.  Has had no chest pain, chest pressure, chest tightness, chest stinging. No shortness of breath, DOE is new.  No PND or orthopnea.  Has had no syncope.  No history of pre-eclampsia, gestation HTN or gestational DM.  No Fen-Phen or drug use  Past Medical History:  Diagnosis Date   ADD (attention deficit disorder)    Anxiety    Family history of adverse reaction to anesthesia    MOTHER HAD PONV   Ovarian cyst, follicular 10/14/2014   Pelvic pain in female 10/14/2014    Past Surgical History:  Procedure Laterality Date   COLONOSCOPY N/A 07/04/2018   Procedure: COLONOSCOPY;  Surgeon: West Bali, MD;  [facility];  Service: Endoscopy;  Laterality: N/A;  2:00pm   LAPAROSCOPY N/A 10/15/2014   Procedure: LAPAROSCOPY OPERATIVE;  Surgeon: Sherian Rein, MD;  Location: [facility];  Service: Gynecology;  Laterality: N/A;   OVARIAN CYST REMOVAL Right 10/15/2014   Procedure: OVARIAN CYSTECTOMY;  Surgeon: Sherian Rein, MD;  Location: [facility];  Service: Gynecology;  Laterality: Right;  Dr only needs 1hr OR time   WISDOM TOOTH EXTRACTION      Current  Medications: Current Meds  Medication Sig   Prenatal Vit-Fe Fumarate-FA (PRENATAL COMPLETE) 14-0.4 MG TABS Take 1 tablet by mouth daily.     Allergies:   Patient has no known allergies.   Social History   Socioeconomic History   Marital status: Single    Spouse name: Not on file   Number of children: 0   Years of education: 16   Highest education level: Bachelor's degree (e.g., BA, AB, BS)  Occupational History   Occupation: Hospital doctor: [employer]  Tobacco Use   Smoking status: Never   Smokeless tobacco: Never  Building services engineer Use: Never used  Substance and Sexual Activity   Alcohol use: Not Currently   Drug use: No   Sexual activity: Yes    Birth control/protection: None    Comment: Current Pregnancy  Other Topics Concern   Not on file  Social History Narrative   [employer]   Social Determinants of Health   Financial Resource Strain: Not on file  Food Insecurity: Not on file  Transportation Needs: Not on file  Physical Activity: Not on file  Stress: Not on file  Social Connections: Not on file    Social: 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 There is no history of Colon cancer or Colon polyps. Grandfather and aunt (dad's side) had SCD in 30s and 64s.  ROS:   Please see the history of present illness.     All other systems reviewed and are negative.  EKGs/Labs/Other Studies Reviewed:    The following studies were reviewed today:  EKG:  EKG is  ordered today.  The ekg ordered today demonstrates  05/09/21: SR 92 LVH  Cardiac Event Monitoring: Date: 07/04/2017 Results: SR and sinus tachycardia- 10 events Minimal PACs and PVCs  Transthoracic Echocardiogram: Date: 06/28/2017 Results: - Left ventricle: The cavity size was  normal. Wall thickness was    normal. Systolic function was normal. The estimated ejection    fraction was in the range of 50% to 55%. Wall motion was normal;    there were no regional wall motion abnormalities. Left    ventricular diastolic function parameters were normal.  - Aortic valve: Valve area (VTI): 2.39 cm^2. Valve area (Vmax):    2.36 cm^2.  - Technically adequate study.   Recent Labs: No results found for requested labs within last 8760 hours.  Recent Lipid Panel No results found for: CHOL, TRIG, HDL, CHOLHDL, VLDL, LDLCALC, LDLDIRECT       Physical Exam:    VS:  BP 100/70 (BP Location: Left Arm, Patient Position: Sitting, Cuff Size: Normal)   Pulse 89   Ht 5\' 3"  (1.6 m)   Wt 196 lb (88.9 kg)   SpO2 98%   BMI 34.72 kg/m     Wt Readings from Last 3 Encounters:  05/10/21 196 lb (88.9 kg)  12/01/20 173 lb (78.5 kg)  10/10/20 174 lb (78.9 kg)     GEN:  Well nourished, well developed in no acute distress HEENT: Normal NECK: No JVD  LYMPHATICS: No lymphadenopathy CARDIAC: RRR, no murmurs, rubs, gallops, no RV heave RESPIRATORY:  Clear to auscultation without rales, wheezing or rhonchi  ABDOMEN: Soft, non-tender, non-distended (pregnant) MUSCULOSKELETAL:  No edema; No deformity  SKIN: Warm and dry NEUROLOGIC:  Alert and oriented x 3 PSYCHIATRIC:  Normal affect   ASSESSMENT:    1. Palpitations   2. Near syncope   3. PAC (premature atrial contraction)    PLAN:    Palpitations; possible SVT PACs Near Syncope In the setting of pregnancy - P-SVT on ddx - will obtain 7-day non live heart monitor (ZioPatch) - AV Nodal Therapy: given pregnancy nifedipine 20 mg TID could be used for suppression if needed  - given near syncope will expedite echo (preferably at [facility]) - patient is planning to deliver at [facility]; we are available peri-delivery - will see post delivery (11/24/21 with me at our [facility] office) unless new issues  occur  Medication Adjustments/Labs and Tests Ordered: Current medicines are reviewed at length with the patient today.  Concerns regarding medicines are outlined above.  Orders Placed This Encounter  Procedures   LONG TERM MONITOR (3-14 DAYS)   EKG 12-Lead   ECHOCARDIOGRAM COMPLETE   No orders of the defined types were placed in this encounter.   Patient Instructions  Medication Instructions:  Your physician recommends that you continue on your current medications as directed. Please refer to the Current Medication list given to you today.  *If you need a refill on your cardiac medications before your next appointment, please call your pharmacy*   Lab Work: NONE If you have labs (blood  work) drawn today and your tests are completely normal, you will receive your results only by: MyChart Message (if you have MyChart) OR A paper copy in the mail If you have any lab test that is abnormal or we need to change your treatment, we will call you to review the results.   Testing/Procedures: Your physician has requested that you have an echocardiogram. Echocardiography is a painless test that uses sound waves to create images of your heart. It provides your doctor with information about the size and shape of your heart and how well your heart's chambers and valves are working. This procedure takes approximately one hour. There are no restrictions for this procedure.   Your physician has requested that you wear a 7 day heart monitor.    Follow-Up: At [facility], you and your health needs are our priority.  As part of our continuing mission to provide you with exceptional heart care, we have created designated Provider Care Teams.  These Care Teams include your primary Cardiologist (physician) and Advanced Practice Providers (APPs -  Physician Assistants and Nurse Practitioners) who all work together to provide you with the care you need, when you need it.  Your next appointment:   In  March   The format for your next appointment:   In Person  Provider:   You may see Riley Lam, MD or one of the following Advanced Practice Providers on your designated Care Team:   Randall An, PA-C  Jacolyn Reedy, PA-C    Other Instructions  Christena Deem- Long Term Monitor Instructions  Your physician has requested you wear a ZIO patch monitor for 7 days.  This is a single patch monitor. [employer] supplies one patch monitor per enrollment. Additional stickers are not available. Please do not apply patch if you will be having a Nuclear Stress Test,  Echocardiogram, Cardiac CT, MRI, or Chest Xray during the period you would be wearing the  monitor. The patch cannot be worn during these tests. You cannot remove and re-apply the  ZIO XT patch monitor.  Your ZIO patch monitor will be mailed 3 day USPS to your address on file. It may take 3-5 days  to receive your monitor after you have been enrolled.  Once you have received your monitor, please review the enclosed instructions. Your monitor  has already been registered assigning a specific monitor serial # to you.  Billing and Patient Assistance Program Information  We have supplied [employer] with any of your insurance information on file for billing purposes. [employer] offers a sliding scale Patient Assistance Program for patients that do not have  insurance, or whose insurance does not completely cover the cost of the ZIO monitor.  You must apply for the Patient Assistance Program to qualify for this discounted rate.  To apply, please call [employer] at [phone], select option 4, select option 2, ask to apply for  Patient Assistance Program. Meredeth Ide will ask your household income, and how many people  are in your household. They will quote your out-of-pocket cost based on that information.  [employer] will also be able to set up a 45-month, interest-free payment plan if needed.  Applying the monitor   Shave hair from upper left  chest.  Hold abrader disc by orange tab. Rub abrader in 40 strokes over the upper left chest as  indicated in your monitor instructions.  Clean area with 4 enclosed alcohol pads. Let dry.  Apply patch as indicated in monitor instructions. Patch will be placed  under collarbone on left  side of chest with arrow pointing upward.  Rub patch adhesive wings for 2 minutes. Remove white label marked "1". Remove the white  label marked "2". Rub patch adhesive wings for 2 additional minutes.  While looking in a mirror, press and release button in center of patch. A small green light will  flash 3-4 times. This will be your only indicator that the monitor has been turned on.  Do not shower for the first 24 hours. You may shower after the first 24 hours.  Press the button if you feel a symptom. You will hear a small click. Record Date, Time and  Symptom in the Patient Logbook.  When you are ready to remove the patch, follow instructions on the last 2 pages of Patient  Logbook. Stick patch monitor onto the last page of Patient Logbook.  Place Patient Logbook in the blue and white box. Use locking tab on box and tape box closed  securely. The blue and white box has prepaid postage on it. Please place it in the mailbox as  soon as possible. Your physician should have your test results approximately 7 days after the  monitor has been mailed back to [facility].  Call [facility] Customer Care at [phone] if you have questions regarding  your ZIO XT patch monitor. Call them immediately if you see an orange light blinking on your  monitor.  If your monitor falls off in less than 4 days, contact our Monitor department at [phone].  If your monitor becomes loose or falls off after 4 days call [employer] at [phone] for  suggestions on securing your monitor    Signed, Christell Constant, MD  05/10/2021 3:32 PM    [facility]

## 2021-05-10 ENCOUNTER — Other Ambulatory Visit: Payer: Self-pay

## 2021-05-10 ENCOUNTER — Ambulatory Visit (INDEPENDENT_AMBULATORY_CARE_PROVIDER_SITE_OTHER): Payer: Managed Care, Other (non HMO) | Admitting: Internal Medicine

## 2021-05-10 ENCOUNTER — Ambulatory Visit (INDEPENDENT_AMBULATORY_CARE_PROVIDER_SITE_OTHER): Payer: Managed Care, Other (non HMO)

## 2021-05-10 ENCOUNTER — Encounter: Payer: Self-pay | Admitting: Internal Medicine

## 2021-05-10 VITALS — BP 100/70 | HR 89 | Ht 63.0 in | Wt 196.0 lb

## 2021-05-10 DIAGNOSIS — R55 Syncope and collapse: Secondary | ICD-10-CM | POA: Diagnosis not present

## 2021-05-10 DIAGNOSIS — R002 Palpitations: Secondary | ICD-10-CM

## 2021-05-10 DIAGNOSIS — I491 Atrial premature depolarization: Secondary | ICD-10-CM | POA: Diagnosis not present

## 2021-05-10 NOTE — Progress Notes (Unsigned)
Patient enrolled for [employer] to mail a 7 day ZIO XT monitor to her address on file. 

## 2021-05-10 NOTE — Patient Instructions (Signed)
Medication Instructions:  Your physician recommends that you continue on your current medications as directed. Please refer to the Current Medication list given to you today.  *If you need a refill on your cardiac medications before your next appointment, please call your pharmacy*   Lab Work: NONE If you have labs (blood work) drawn today and your tests are completely normal, you will receive your results only by: MyChart Message (if you have MyChart) OR A paper copy in the mail If you have any lab test that is abnormal or we need to change your treatment, we will call you to review the results.   Testing/Procedures: Your physician has requested that you have an echocardiogram. Echocardiography is a painless test that uses sound waves to create images of your heart. It provides your doctor with information about the size and shape of your heart and how well your heart's chambers and valves are working. This procedure takes approximately one hour. There are no restrictions for this procedure.   Your physician has requested that you wear a 7 day heart monitor.    Follow-Up: At [facility], you and your health needs are our priority.  As part of our continuing mission to provide you with exceptional heart care, we have created designated Provider Care Teams.  These Care Teams include your primary Cardiologist (physician) and Advanced Practice Providers (APPs -  Physician Assistants and Nurse Practitioners) who all work together to provide you with the care you need, when you need it.  Your next appointment:   In March   The format for your next appointment:   In Person  Provider:   You may see Riley Lam, MD or one of the following Advanced Practice Providers on your designated Care Team:   Randall An, PA-C  Jacolyn Reedy, PA-C    Other Instructions  Christena Deem- Long Term Monitor Instructions  Your physician has requested you wear a ZIO patch monitor for 7 days.   This is a single patch monitor. [employer] supplies one patch monitor per enrollment. Additional stickers are not available. Please do not apply patch if you will be having a Nuclear Stress Test,  Echocardiogram, Cardiac CT, MRI, or Chest Xray during the period you would be wearing the  monitor. The patch cannot be worn during these tests. You cannot remove and re-apply the  ZIO XT patch monitor.  Your ZIO patch monitor will be mailed 3 day USPS to your address on file. It may take 3-5 days  to receive your monitor after you have been enrolled.  Once you have received your monitor, please review the enclosed instructions. Your monitor  has already been registered assigning a specific monitor serial # to you.  Billing and Patient Assistance Program Information  We have supplied [employer] with any of your insurance information on file for billing purposes. [employer] offers a sliding scale Patient Assistance Program for patients that do not have  insurance, or whose insurance does not completely cover the cost of the ZIO monitor.  You must apply for the Patient Assistance Program to qualify for this discounted rate.  To apply, please call [employer] at [phone], select option 4, select option 2, ask to apply for  Patient Assistance Program. Meredeth Ide will ask your household income, and how many people  are in your household. They will quote your out-of-pocket cost based on that information.  [employer] will also be able to set up a 91-month, interest-free payment plan if needed.  Applying the monitor  Shave hair from upper left chest.  Hold abrader disc by orange tab. Rub abrader in 40 strokes over the upper left chest as  indicated in your monitor instructions.  Clean area with 4 enclosed alcohol pads. Let dry.  Apply patch as indicated in monitor instructions. Patch will be placed under collarbone on left  side of chest with arrow pointing upward.  Rub patch adhesive wings for 2 minutes. Remove  white label marked "1". Remove the white  label marked "2". Rub patch adhesive wings for 2 additional minutes.  While looking in a mirror, press and release button in center of patch. A small green light will  flash 3-4 times. This will be your only indicator that the monitor has been turned on.  Do not shower for the first 24 hours. You may shower after the first 24 hours.  Press the button if you feel a symptom. You will hear a small click. Record Date, Time and  Symptom in the Patient Logbook.  When you are ready to remove the patch, follow instructions on the last 2 pages of Patient  Logbook. Stick patch monitor onto the last page of Patient Logbook.  Place Patient Logbook in the blue and white box. Use locking tab on box and tape box closed  securely. The blue and white box has prepaid postage on it. Please place it in the mailbox as  soon as possible. Your physician should have your test results approximately 7 days after the  monitor has been mailed back to [facility].  Call [facility] Customer Care at [phone] if you have questions regarding  your ZIO XT patch monitor. Call them immediately if you see an orange light blinking on your  monitor.  If your monitor falls off in less than 4 days, contact our Monitor department at [phone].  If your monitor becomes loose or falls off after 4 days call [employer] at [phone] for  suggestions on securing your monitor

## 2021-05-15 DIAGNOSIS — R002 Palpitations: Secondary | ICD-10-CM

## 2021-05-15 DIAGNOSIS — R55 Syncope and collapse: Secondary | ICD-10-CM | POA: Diagnosis not present

## 2021-06-01 ENCOUNTER — Ambulatory Visit (HOSPITAL_COMMUNITY): Admission: RE | Admit: 2021-06-01 | Payer: Managed Care, Other (non HMO) | Source: Ambulatory Visit

## 2021-06-01 LAB — OB RESULTS CONSOLE GBS: GBS: NEGATIVE

## 2021-06-23 ENCOUNTER — Encounter (HOSPITAL_COMMUNITY): Payer: Self-pay | Admitting: *Deleted

## 2021-06-23 ENCOUNTER — Telehealth (HOSPITAL_COMMUNITY): Payer: Self-pay | Admitting: *Deleted

## 2021-06-23 NOTE — Telephone Encounter (Signed)
Preadmission screen  

## 2021-06-29 ENCOUNTER — Other Ambulatory Visit: Payer: Self-pay

## 2021-06-29 ENCOUNTER — Other Ambulatory Visit: Payer: Self-pay | Admitting: Obstetrics and Gynecology

## 2021-06-30 ENCOUNTER — Inpatient Hospital Stay (HOSPITAL_COMMUNITY)
Admission: AD | Admit: 2021-06-30 | Discharge: 2021-07-03 | DRG: 786 | Disposition: A | Discharge status: Home / Self Care | Payer: Managed Care, Other (non HMO) | Attending: Obstetrics and Gynecology | Admitting: Obstetrics and Gynecology

## 2021-06-30 ENCOUNTER — Inpatient Hospital Stay (HOSPITAL_COMMUNITY): Payer: Managed Care, Other (non HMO) | Admitting: Anesthesiology

## 2021-06-30 ENCOUNTER — Other Ambulatory Visit: Payer: Self-pay

## 2021-06-30 ENCOUNTER — Encounter (HOSPITAL_COMMUNITY): Payer: Self-pay | Admitting: Obstetrics and Gynecology

## 2021-06-30 ENCOUNTER — Inpatient Hospital Stay (HOSPITAL_COMMUNITY): Payer: Managed Care, Other (non HMO)

## 2021-06-30 DIAGNOSIS — Z20822 Contact with and (suspected) exposure to covid-19: Secondary | ICD-10-CM | POA: Diagnosis present

## 2021-06-30 DIAGNOSIS — D509 Iron deficiency anemia, unspecified: Secondary | ICD-10-CM | POA: Diagnosis present

## 2021-06-30 DIAGNOSIS — Z98891 History of uterine scar from previous surgery: Secondary | ICD-10-CM

## 2021-06-30 DIAGNOSIS — O99019 Anemia complicating pregnancy, unspecified trimester: Secondary | ICD-10-CM | POA: Diagnosis present

## 2021-06-30 DIAGNOSIS — O9902 Anemia complicating childbirth: Principal | ICD-10-CM | POA: Diagnosis present

## 2021-06-30 DIAGNOSIS — Z3A39 39 weeks gestation of pregnancy: Secondary | ICD-10-CM | POA: Diagnosis not present

## 2021-06-30 DIAGNOSIS — O9942 Diseases of the circulatory system complicating childbirth: Secondary | ICD-10-CM | POA: Diagnosis present

## 2021-06-30 DIAGNOSIS — Z3490 Encounter for supervision of normal pregnancy, unspecified, unspecified trimester: Secondary | ICD-10-CM | POA: Diagnosis present

## 2021-06-30 DIAGNOSIS — F411 Generalized anxiety disorder: Secondary | ICD-10-CM | POA: Diagnosis present

## 2021-06-30 DIAGNOSIS — I491 Atrial premature depolarization: Secondary | ICD-10-CM | POA: Diagnosis present

## 2021-06-30 DIAGNOSIS — O99824 Streptococcus B carrier state complicating childbirth: Secondary | ICD-10-CM | POA: Diagnosis present

## 2021-06-30 DIAGNOSIS — O26893 Other specified pregnancy related conditions, third trimester: Secondary | ICD-10-CM | POA: Diagnosis present

## 2021-06-30 LAB — CBC
HCT: 30.6 % — ABNORMAL LOW (ref 36.0–46.0)
Hemoglobin: 9.6 g/dL — ABNORMAL LOW (ref 12.0–15.0)
MCH: 24.5 pg — ABNORMAL LOW (ref 26.0–34.0)
MCHC: 31.4 g/dL (ref 30.0–36.0)
MCV: 78.1 fL — ABNORMAL LOW (ref 80.0–100.0)
Platelets: 232 10*3/uL (ref 150–400)
RBC: 3.92 MIL/uL (ref 3.87–5.11)
RDW: 16.2 % — ABNORMAL HIGH (ref 11.5–15.5)
WBC: 10.8 10*3/uL — ABNORMAL HIGH (ref 4.0–10.5)
nRBC: 0 % (ref 0.0–0.2)

## 2021-06-30 LAB — RAPID HIV SCREEN (HIV 1/2 AB+AG)
HIV 1/2 Antibodies: NONREACTIVE
HIV-1 P24 Antigen - HIV24: NONREACTIVE

## 2021-06-30 LAB — TYPE AND SCREEN
ABO/RH(D): A POS
Antibody Screen: NEGATIVE

## 2021-06-30 LAB — RPR: RPR Ser Ql: NONREACTIVE

## 2021-06-30 LAB — RESP PANEL BY RT-PCR (FLU A&B, COVID) ARPGX2
Influenza A by PCR: NEGATIVE
Influenza B by PCR: NEGATIVE
SARS Coronavirus 2 by RT PCR: NEGATIVE

## 2021-06-30 MED ORDER — LACTATED RINGERS IV SOLN: 500.0000 mL | Freq: Once | INTRAVENOUS | Status: DC

## 2021-06-30 MED ORDER — LACTATED RINGERS IV SOLN
500.0000 mL | INTRAVENOUS | Status: DC | PRN
Start: 2021-06-30 — End: 2021-07-01
  Administered 2021-06-30: 1000 mL via INTRAVENOUS

## 2021-06-30 MED ORDER — MISOPROSTOL 50MCG HALF TABLET
50.0000 ug | ORAL_TABLET | Freq: Once | ORAL | Status: AC
  Administered 2021-06-30: 50 ug via ORAL

## 2021-06-30 MED ORDER — PHENYLEPHRINE 40 MCG/ML (10ML) SYRINGE FOR IV PUSH (FOR BLOOD PRESSURE SUPPORT): 80.0000 ug | PREFILLED_SYRINGE | INTRAVENOUS | Status: DC | PRN

## 2021-06-30 MED ORDER — FENTANYL-BUPIVACAINE-NACL 0.5-0.125-0.9 MG/250ML-% EP SOLN: 12.0000 mL/h | EPIDURAL | Status: DC | PRN

## 2021-06-30 MED ORDER — MISOPROSTOL 50MCG HALF TABLET
ORAL_TABLET | ORAL | Status: AC
  Filled 2021-06-30: qty 1

## 2021-06-30 MED ORDER — FENTANYL-BUPIVACAINE-NACL 0.5-0.125-0.9 MG/250ML-% EP SOLN
EPIDURAL | Status: AC
  Filled 2021-06-30: qty 250

## 2021-06-30 MED ORDER — ACETAMINOPHEN 325 MG PO TABS: 650.0000 mg | ORAL_TABLET | ORAL | Status: DC | PRN

## 2021-06-30 MED ORDER — LIDOCAINE HCL (PF) 1 % IJ SOLN: 30.0000 mL | INTRAMUSCULAR | Status: DC | PRN

## 2021-06-30 MED ORDER — TERBUTALINE SULFATE 1 MG/ML IJ SOLN: 0.2500 mg | Freq: Once | INTRAMUSCULAR | Status: DC | PRN

## 2021-06-30 MED ORDER — OXYTOCIN BOLUS FROM INFUSION: 333.0000 mL | Freq: Once | INTRAVENOUS | Status: DC

## 2021-06-30 MED ORDER — OXYTOCIN-SODIUM CHLORIDE 30-0.9 UT/500ML-% IV SOLN: 2.5000 [IU]/h | INTRAVENOUS | Status: DC

## 2021-06-30 MED ORDER — OXYTOCIN-SODIUM CHLORIDE 30-0.9 UT/500ML-% IV SOLN
1.0000 m[IU]/min | INTRAVENOUS | Status: DC
  Administered 2021-06-30: 2 m[IU]/min via INTRAVENOUS
  Filled 2021-06-30: qty 500

## 2021-06-30 MED ORDER — DIPHENHYDRAMINE HCL 50 MG/ML IJ SOLN: 12.5000 mg | INTRAMUSCULAR | Status: DC | PRN

## 2021-06-30 MED ORDER — FENTANYL CITRATE (PF) 100 MCG/2ML IJ SOLN
100.0000 ug | INTRAMUSCULAR | Status: DC | PRN
  Administered 2021-06-30 (×3): 100 ug via INTRAVENOUS
  Filled 2021-06-30 (×3): qty 2

## 2021-06-30 MED ORDER — ONDANSETRON HCL 4 MG/2ML IJ SOLN: 4.0000 mg | Freq: Four times a day (QID) | INTRAMUSCULAR | Status: DC | PRN

## 2021-06-30 MED ORDER — MISOPROSTOL 25 MCG QUARTER TABLET
25.0000 ug | ORAL_TABLET | ORAL | Status: DC | PRN
  Administered 2021-06-30: 25 ug via VAGINAL
  Filled 2021-06-30: qty 1

## 2021-06-30 MED ORDER — SOD CITRATE-CITRIC ACID 500-334 MG/5ML PO SOLN
30.0000 mL | ORAL | Status: DC | PRN
  Administered 2021-07-01: 30 mL via ORAL
  Filled 2021-06-30: qty 30

## 2021-06-30 MED ORDER — EPHEDRINE 5 MG/ML INJ: 10.0000 mg | INTRAVENOUS | Status: DC | PRN

## 2021-06-30 MED ORDER — LACTATED RINGERS IV SOLN: INTRAVENOUS | Status: DC

## 2021-06-30 NOTE — H&P (Signed)
Anne Underwood is a 25 y.o. female G1P0 [redacted]w[redacted]d presenting for elective IOL at term.  Pregnancy dated by LMP c/w 12 sono. Prenatal care initiated at outside practice in [place], transfer of care to [facility] at 21 weeks and has received routine PNC there since then. Records received from previous practice showed normal routine prenatal labs and low risk NT scan. She had a normal anatomy US in our office. She had normal 28w labs with high normal 1hr GTT of 134. Mild IDA with Hgb 10.7 and started on oral iron. Growth US done at 34 weeks showed cephalic presentation with EFW6#3 (88%) HC 50% AC 90% AFI 20.5 cm. Patient has expressed some anxiety in anticipation of labor and had requested induction for more controlled labor experience. Previously on Cymbalta for anxiety but discontinued in pregnancy. She has been having irregular contractions since 35 weeks and did receive BMZ 10/3-10/4 for threatened PTL, but no significant cervical changes since then.  Medical history significant for palpitations. History of normal echo in 2018. She did experience exacerbation of palpitations and intermittent tachycardia episodes during the pregnancy. She had cardiology consultation on 05/10/21 with diagnosis of PAC and possible SVT and had recommended follow up postpartum. Surgical history significant for ovarian cystectomy laparoscopy in 2016.  OB History     Gravida  1   Para      Term      Preterm      AB      Living         SAB      IAB      Ectopic      Multiple      Live Births             Past Medical History:  Diagnosis Date   ADD (attention deficit disorder)    Anxiety    Family history of adverse reaction to anesthesia    MOTHER HAD PONV   Ovarian cyst, follicular 10/14/2014   Pelvic pain in female 10/14/2014   Past Surgical History:  Procedure Laterality Date   COLONOSCOPY N/A 07/04/2018   Procedure: COLONOSCOPY;  Surgeon: West Bali, MD;  [facility];  Service:  Endoscopy;  Laterality: N/A;  2:00pm   LAPAROSCOPY N/A 10/15/2014   Procedure: LAPAROSCOPY OPERATIVE;  Surgeon: Sherian Rein, MD;  Location: [facility];  Service: Gynecology;  Laterality: N/A;   OVARIAN CYST REMOVAL Right 10/15/2014   Procedure: OVARIAN CYSTECTOMY;  Surgeon: Sherian Rein, MD;  Location: [facility];  Service: Gynecology;  Laterality: Right;  Dr only needs 1hr OR time   WISDOM TOOTH EXTRACTION     Family History: family history includes COPD in her maternal grandfather; Cancer (age of onset: 75) in her mother; Healthy in her father and mother; Heart attack in her paternal grandfather. Social History:  reports that she has never smoked. She has never used smokeless tobacco. She reports that she does not currently use alcohol. She reports that she does not use drugs.     Maternal Diabetes: No Genetic Screening: Normal Maternal Ultrasounds/Referrals: Normal Fetal Ultrasounds or other Referrals:  None Maternal Substance Abuse:  No Significant Maternal Medications:  None Significant Maternal Lab Results:  Group B Strep negative Other Comments:  None  Review of Systems  All other systems reviewed and are negative. Per HPI Maternal Exam:  Abdomen: Estimated fetal weight is 7#15.   Fetal presentation: vertex   Fetal Exam Fetal State Assessment: Category I - tracings are normal.  Physical Exam  Vitals reviewed.  Constitutional:      Appearance: Normal appearance.  HENT:     Head: Normocephalic.  Cardiovascular:     Rate and Rhythm: Normal rate.  Pulmonary:     Effort: Pulmonary effort is normal.  Abdominal:     Palpations: Abdomen is soft.  Musculoskeletal:        General: Normal range of motion.     Cervical back: Normal range of motion.  Skin:    General: Skin is warm and dry.  Neurological:     General: No focal deficit present.     Mental Status: She is alert and oriented to person, place, and time.  Psychiatric:        Mood and Affect: Mood normal.         Behavior: Behavior normal.    Dilation: 4 Effacement (%): 70 Station: -3 Exam by:: [employer] RN Blood pressure 110/70, pulse 87, temperature 98.2 F (36.8 C), temperature source Oral, resp. rate 17, height 5\' 3"  (1.6 m), weight 93.3 kg, SpO2 98 %.  Prenatal labs: ABO, Rh:  --/--/A POS (11/04 0025) Antibody: NEG (11/04 0025) Rubella: Immune (04/04 0000) RPR: NON REACTIVE (11/04 0004)  HBsAg: Indeterminate, Negative (04/04 0000)  HIV: NON REACTIVE (11/04 0004)  GBS: Negative/-- (10/06 0000)   ChemistryNo results for input(s): NA, K, CL, CO2, GLUCOSE, BUN, CREATININE, CALCIUM, GFRNONAA, GFRAA, ANIONGAP in the last 168 hours.  No results for input(s): PROT, ALBUMIN, AST, ALT, ALKPHOS, BILITOT in the last 168 hours. Hematology Recent Labs  Lab 06/30/21 0004  WBC 10.8*  RBC 3.92  HGB 9.6*  HCT 30.6*  MCV 78.1*  MCH 24.5*  MCHC 31.4  RDW 16.2*  PLT 232   Cardiac EnzymesNo results for input(s): TROPONINI in the last 168 hours. No results for input(s): TROPIPOC in the last 168 hours.  BNPNo results for input(s): BNP, PROBNP in the last 168 hours.  DDimer No results for input(s): DDIMER in the last 168 hours.   Assessment/Plan: Anne Underwood is a 25 y.o. female G1P0 [redacted]w[redacted]d admitted with elective IOL at term.  -Admission to LD for IOL mgmt -Routine admission labs -IOL s/p 2 doses of cytotec and Foley balloon, now on Pitocin- cont to titrate 2x2 per protocol. Plan for AROM when able -Cont EFM/Toco -Pt may have epidural when requested -Chronic IDA admission Hgb 9.6, check CBC PP and consider IV iron therapy before d/c -Cardiac hx of PCA and work up for possible SVT, plan for PP outpatient f/u -Hx anxiety, monitor for PPD and consider restarting medication therapy PP if patient interested -Routine intrapartum care, anticipate SVD  Saryna Kneeland A Narelle Schoening 06/30/2021, 3:40 PM

## 2021-06-30 NOTE — Progress Notes (Signed)
Labor Progress Note  S/O: Pt resting comfortably with epidural in place, denies any feelings of rectal pressure. Husband at bedside providing labor support  Vitals:   06/30/21 2330 06/30/21 2342  BP: (!) 91/50   Pulse: 87   Resp: 16   Temp:  98.4 F (36.9 C)  SpO2:      SVE: 6/80/-2 some anterior swelling appreciated  EFM: now returned to cat I with baseline 140 bpm mod var and no decels s/p repositioning and Pitocin turned off; previously cat II with recurrent late decelerations Toco: ctxs spaced to every 3-5 min  A/P: 25Y G1P0 @ 39.4 elective IOL at term GBS-/Rh+/RI  -IOL: s/p 2 doses of cytotec, foley balloon, and AROM. Pitocin turned off for recurrent late decelerations. Slow cervical change, remote from delivery. I reviewed the FHT with patient and husband and concerns for fetal intolerance to labor. Tracing currently reassuring with Pitocin off but would recommend proceeding with cesarean delivery at this time for NRFHT -Patient consented for cesarean section including review of risks of bleeding, infection, damage to surrounding organs, increased risk for future pregnancies such as need for repeat cesarean section, and DVT/VTE risks. Patient is agreeable to blood transfusion if indicated -OR team notified -Ancef and Azithromycin for antibiotic ppx -SCD for DVT ppx -Will keep on continuous EFM/Toco until OR- currently cat I  -Routine intraop/postop care  Anne Underwood 07/01/21 12:04 AM

## 2021-06-30 NOTE — Progress Notes (Signed)
Labor Progress Note  S/O: Pt managing ctxs well with IV pain meds, just received Fentanyl 30 min prior but had been feeling more uncomfortable with ctxs before that. Husband at bedside providing supportive care  Vitals:   06/30/21 1540 06/30/21 1655  BP: 111/72 106/60  Pulse: 75 72  Resp: 16 17  Temp:    SpO2:     SVE: 4/70/-2 AROM for clear fluid  EFM: cat I baseline 140 bpm mod var +accels, -decels Toco: ctxs q 2-6 min  A/P: 25Y G1P0 @ 39.4 elective IOL at term GBS-/Rh+/RI  -IOL s/p 2 cytotec and balloon. Currently on Pitocin of 28mU. Reviewed r/b/a to AROM, pt agrees. AROM performed- clear fluid. Will cont to titrate Pitocin by 2's per protocol -Cont EFM/Toco -Pt may have epidural when requested  -Routine intrapartum care -Anticipate SVD  Hiep Ollis A Wauneta Silveria 06/30/21 5:11 PM

## 2021-06-30 NOTE — Anesthesia Preprocedure Evaluation (Signed)
Anesthesia Evaluation  Patient identified by MRN, date of birth, ID band Patient awake    Reviewed: Allergy & Precautions, Patient's Chart, lab work & pertinent test results  Airway Mallampati: II  TM Distance: >3 FB     Dental   Pulmonary neg pulmonary ROS,    Pulmonary exam normal        Cardiovascular negative cardio ROS Normal cardiovascular exam     Neuro/Psych negative neurological ROS     GI/Hepatic negative GI ROS, Neg liver ROS,   Endo/Other  negative endocrine ROS  Renal/GU negative Renal ROS     Musculoskeletal   Abdominal   Peds  Hematology  (+) anemia ,   Anesthesia Other Findings   Reproductive/Obstetrics (+) Pregnancy                             Lab Results  Component Value Date   WBC 10.8 (H) 06/30/2021   HGB 9.6 (L) 06/30/2021   HCT 30.6 (L) 06/30/2021   MCV 78.1 (L) 06/30/2021   PLT 232 06/30/2021    Anesthesia Physical Anesthesia Plan  ASA: 2  Anesthesia Plan: Epidural   Post-op Pain Management:    Induction:   PONV Risk Score and Plan: Treatment may vary due to age or medical condition  Airway Management Planned: Natural Airway  Additional Equipment:   Intra-op Plan:   Post-operative Plan:   Informed Consent: I have reviewed the patients History and Physical, chart, labs and discussed the procedure including the risks, benefits and alternatives for the proposed anesthesia with the patient or authorized representative who has indicated his/her understanding and acceptance.       Plan Discussed with:   Anesthesia Plan Comments:         Anesthesia Quick Evaluation

## 2021-06-30 NOTE — Anesthesia Procedure Notes (Signed)
Epidural Patient location during procedure: OB Start time: 06/30/2021 5:42 PM End time: 06/30/2021 5:49 PM  Staffing Anesthesiologist: Marcene Duos, MD Performed: anesthesiologist   Preanesthetic Checklist Completed: patient identified, IV checked, site marked, risks and benefits discussed, surgical consent, monitors and equipment checked, pre-op evaluation and timeout performed  Epidural Patient position: sitting Prep: DuraPrep and site prepped and draped Patient monitoring: continuous pulse ox and blood pressure Approach: midline Location: L4-L5 Injection technique: LOR air  Needle:  Needle type: Tuohy  Needle gauge: 17 G Needle length: 9 cm and 9 Needle insertion depth: 5.5 cm Catheter type: closed end flexible Catheter size: 19 Gauge Catheter at skin depth: 10.5 cm Test dose: negative  Assessment Events: blood not aspirated, injection not painful, no injection resistance, no paresthesia and negative IV test

## 2021-06-30 NOTE — Progress Notes (Signed)
Tracing Note  Called by RN to review FHT in setting of late decelerations. Patient is now comfortable with epidural placed approximately 3 hours earlier. Has had mostly category I tracing throughout the day. Patient position changed from high fowlers to lateral which resulted in change to category II tracing. Tracing reviewed- recurrent late decelerations noted. Cervical checked performed during that time 6/80/-2 per RN. Fluid bolus was started and maternal repositioning back to high fowlers. Improvement of tracing with resolution of decelerations noted. Will keep Pitocin on since tracing improved, currently at 10 mU with contractions every 2 minutes. Will continue to monitor tracing closely and reassess as needed.  Damilola Flamm A Jameis Newsham 06/30/21 10:01 PM

## 2021-06-30 NOTE — Progress Notes (Signed)
Anne Underwood is a 25 y.o. G1P0 at [redacted]w[redacted]d by ultrasound admitted for  elective IOL at term. Cytotec x 1 dose overnight for ripening.  MD request for CNM eval - cervical balloon placement  Subjective:  Resting in bed, ctx more painful for past hour. Spouse Anne Underwood present at [facility] and supportive.  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  R/B of procedure discussed and agrees.   Objective: Vitals:   06/30/21 0058 06/30/21 0339 06/30/21 0531 06/30/21 0835  BP: 116/67 103/60 105/64 103/66  Pulse: 79 83 80 91  Temp: 98.2 F (36.8 C)     TempSrc: Oral     Weight:      Height:        FHT:  FHR: 130 bpm, variability: moderate,  accelerations:  Present,  decelerations:  Absent UC:   irregular, every 1.5-4 minutes SVE:   Dilation: 2 Effacement (%): 60 Station: Ballotable Exam by:: Anne Underwood CNM Vertex CB placed w/ ease, inflated with 60 cc fluid, traction to leg Patient tolerated procedure well.   Labs:   Recent Labs    06/30/21 0004  WBC 10.8*  HGB 9.6*  HCT 30.6*  PLT 232    Assessment / Plan: G21P0 25 y.o. [redacted]w[redacted]d IOL - elective, cervical ripening in process  Labor:  Continue ripening with CB and rpt Cytotec, will give 50 mcg buccal Preeclampsia:  no signs or symptoms of toxicity and intake and ouput balanced Fetal Wellbeing:  Category I Pain Control:  Nitrous Oxide I/D:   GBS positive, Ancef x 2 doses given, continue per protocol until  delivered Anticipated MOD:  NSVD  Dr. Conni Underwood updated with patient status  Anne Underwood, CNM, MSN 06/30/2021, 9:35  AM

## 2021-07-01 ENCOUNTER — Encounter (HOSPITAL_COMMUNITY): Payer: Self-pay | Admitting: Obstetrics and Gynecology

## 2021-07-01 ENCOUNTER — Encounter (HOSPITAL_COMMUNITY)
Admission: AD | Disposition: A | Discharge status: Home / Self Care | Payer: Self-pay | Source: Home / Self Care | Attending: Obstetrics and Gynecology

## 2021-07-01 DIAGNOSIS — Z98891 History of uterine scar from previous surgery: Secondary | ICD-10-CM

## 2021-07-01 DIAGNOSIS — D509 Iron deficiency anemia, unspecified: Secondary | ICD-10-CM | POA: Diagnosis present

## 2021-07-01 LAB — CBC
HCT: 23.7 % — ABNORMAL LOW (ref 36.0–46.0)
Hemoglobin: 7.4 g/dL — ABNORMAL LOW (ref 12.0–15.0)
MCH: 24.3 pg — ABNORMAL LOW (ref 26.0–34.0)
MCHC: 31.2 g/dL (ref 30.0–36.0)
MCV: 77.7 fL — ABNORMAL LOW (ref 80.0–100.0)
Platelets: 183 10*3/uL (ref 150–400)
RBC: 3.05 MIL/uL — ABNORMAL LOW (ref 3.87–5.11)
RDW: 16.2 % — ABNORMAL HIGH (ref 11.5–15.5)
WBC: 18.1 10*3/uL — ABNORMAL HIGH (ref 4.0–10.5)
nRBC: 0 % (ref 0.0–0.2)

## 2021-07-01 SURGERY — Surgical Case
Anesthesia: Epidural | Wound class: Clean Contaminated

## 2021-07-01 MED ORDER — PROMETHAZINE HCL 25 MG/ML IJ SOLN: 6.2500 mg | INTRAMUSCULAR | Status: DC | PRN

## 2021-07-01 MED ORDER — FENTANYL CITRATE (PF) 100 MCG/2ML IJ SOLN
INTRAMUSCULAR | Status: DC | PRN
  Administered 2021-07-01 (×2): 100 ug via EPIDURAL

## 2021-07-01 MED ORDER — OXYTOCIN-SODIUM CHLORIDE 30-0.9 UT/500ML-% IV SOLN
INTRAVENOUS | Status: AC
  Filled 2021-07-01: qty 500

## 2021-07-01 MED ORDER — DIBUCAINE (PERIANAL) 1 % EX OINT: 1.0000 "application " | TOPICAL_OINTMENT | CUTANEOUS | Status: DC | PRN

## 2021-07-01 MED ORDER — FENTANYL CITRATE (PF) 100 MCG/2ML IJ SOLN
INTRAMUSCULAR | Status: AC
  Filled 2021-07-01: qty 2

## 2021-07-01 MED ORDER — NALOXONE HCL 4 MG/10ML IJ SOLN
1.0000 ug/kg/h | INTRAVENOUS | Status: DC | PRN
  Filled 2021-07-01: qty 5

## 2021-07-01 MED ORDER — ONDANSETRON HCL 4 MG/2ML IJ SOLN
INTRAMUSCULAR | Status: DC | PRN
  Administered 2021-07-01: 4 mg via INTRAVENOUS

## 2021-07-01 MED ORDER — OXYCODONE HCL 5 MG/5ML PO SOLN: 5.0000 mg | Freq: Once | ORAL | Status: DC | PRN

## 2021-07-01 MED ORDER — SODIUM CHLORIDE 0.9 % IV SOLN
500.0000 mg | Freq: Once | INTRAVENOUS | Status: AC
  Administered 2021-07-01: 500 mg via INTRAVENOUS

## 2021-07-01 MED ORDER — CHLOROPROCAINE HCL (PF) 3 % IJ SOLN
INTRAMUSCULAR | Status: DC | PRN
  Administered 2021-07-01: 10 mL

## 2021-07-01 MED ORDER — SCOPOLAMINE 1 MG/3DAYS TD PT72
MEDICATED_PATCH | TRANSDERMAL | Status: AC
  Filled 2021-07-01: qty 1

## 2021-07-01 MED ORDER — SODIUM CHLORIDE 0.9 % IV SOLN
500.0000 mg | Freq: Once | INTRAVENOUS | Status: AC
  Administered 2021-07-01: 500 mg via INTRAVENOUS
  Filled 2021-07-01: qty 25

## 2021-07-01 MED ORDER — ACETAMINOPHEN 10 MG/ML IV SOLN: 1000.0000 mg | Freq: Once | INTRAVENOUS | Status: DC | PRN

## 2021-07-01 MED ORDER — ZOLPIDEM TARTRATE 5 MG PO TABS: 5.0000 mg | ORAL_TABLET | Freq: Every evening | ORAL | Status: DC | PRN

## 2021-07-01 MED ORDER — NALBUPHINE HCL 10 MG/ML IJ SOLN: 5.0000 mg | INTRAMUSCULAR | Status: DC | PRN

## 2021-07-01 MED ORDER — MORPHINE SULFATE (PF) 0.5 MG/ML IJ SOLN
INTRAMUSCULAR | Status: DC | PRN
  Administered 2021-07-01: 3 mg via EPIDURAL

## 2021-07-01 MED ORDER — SENNOSIDES-DOCUSATE SODIUM 8.6-50 MG PO TABS
2.0000 | ORAL_TABLET | Freq: Every day | ORAL | Status: DC
  Administered 2021-07-02 – 2021-07-03 (×2): 2 via ORAL
  Filled 2021-07-01 (×2): qty 2

## 2021-07-01 MED ORDER — SODIUM CHLORIDE 0.9 % IR SOLN
Status: DC | PRN
  Administered 2021-07-01: 1000 mL

## 2021-07-01 MED ORDER — NALBUPHINE HCL 10 MG/ML IJ SOLN: 5.0000 mg | Freq: Once | INTRAMUSCULAR | Status: DC | PRN

## 2021-07-01 MED ORDER — OXYTOCIN-SODIUM CHLORIDE 30-0.9 UT/500ML-% IV SOLN
INTRAVENOUS | Status: DC | PRN
  Administered 2021-07-01: 30 [IU] via INTRAVENOUS

## 2021-07-01 MED ORDER — FENTANYL CITRATE (PF) 100 MCG/2ML IJ SOLN: 25.0000 ug | INTRAMUSCULAR | Status: DC | PRN

## 2021-07-01 MED ORDER — SIMETHICONE 80 MG PO CHEW: 80.0000 mg | CHEWABLE_TABLET | ORAL | Status: DC | PRN

## 2021-07-01 MED ORDER — KETOROLAC TROMETHAMINE 30 MG/ML IJ SOLN
30.0000 mg | Freq: Once | INTRAMUSCULAR | Status: AC
  Administered 2021-07-01: 30 mg via INTRAVENOUS

## 2021-07-01 MED ORDER — DIPHENHYDRAMINE HCL 25 MG PO CAPS: 25.0000 mg | ORAL_CAPSULE | Freq: Four times a day (QID) | ORAL | Status: DC | PRN

## 2021-07-01 MED ORDER — CEFAZOLIN SODIUM-DEXTROSE 2-4 GM/100ML-% IV SOLN
2.0000 g | Freq: Once | INTRAVENOUS | Status: AC
  Administered 2021-07-01: 2 g via INTRAVENOUS

## 2021-07-01 MED ORDER — SODIUM CHLORIDE 0.9% FLUSH: 3.0000 mL | INTRAVENOUS | Status: DC | PRN

## 2021-07-01 MED ORDER — LIDOCAINE-EPINEPHRINE (PF) 2 %-1:200000 IJ SOLN
INTRAMUSCULAR | Status: DC | PRN
  Administered 2021-07-01 (×2): 5 mL via EPIDURAL
  Administered 2021-07-01: 3 mL via EPIDURAL
  Administered 2021-07-01: 7 mL via EPIDURAL

## 2021-07-01 MED ORDER — OXYTOCIN-SODIUM CHLORIDE 30-0.9 UT/500ML-% IV SOLN: 2.5000 [IU]/h | INTRAVENOUS | Status: AC

## 2021-07-01 MED ORDER — OXYCODONE HCL 5 MG PO TABS: 5.0000 mg | ORAL_TABLET | ORAL | Status: DC | PRN

## 2021-07-01 MED ORDER — NALOXONE HCL 0.4 MG/ML IJ SOLN: 0.4000 mg | INTRAMUSCULAR | Status: DC | PRN

## 2021-07-01 MED ORDER — DIPHENHYDRAMINE HCL 50 MG/ML IJ SOLN: 12.5000 mg | INTRAMUSCULAR | Status: DC | PRN

## 2021-07-01 MED ORDER — ONDANSETRON HCL 4 MG/2ML IJ SOLN: 4.0000 mg | Freq: Three times a day (TID) | INTRAMUSCULAR | Status: DC | PRN

## 2021-07-01 MED ORDER — LIDOCAINE-EPINEPHRINE (PF) 1.5 %-1:200000 IJ SOLN
INTRAMUSCULAR | Status: DC | PRN
  Administered 2021-07-01: 5 mL via EPIDURAL

## 2021-07-01 MED ORDER — DEXAMETHASONE SODIUM PHOSPHATE 10 MG/ML IJ SOLN
INTRAMUSCULAR | Status: AC
  Filled 2021-07-01: qty 1

## 2021-07-01 MED ORDER — SCOPOLAMINE 1 MG/3DAYS TD PT72
1.0000 | MEDICATED_PATCH | Freq: Once | TRANSDERMAL | Status: DC
  Administered 2021-07-01: 1.5 mg via TRANSDERMAL

## 2021-07-01 MED ORDER — SIMETHICONE 80 MG PO CHEW
80.0000 mg | CHEWABLE_TABLET | Freq: Three times a day (TID) | ORAL | Status: DC
  Administered 2021-07-01 – 2021-07-03 (×5): 80 mg via ORAL
  Filled 2021-07-01 (×5): qty 1

## 2021-07-01 MED ORDER — PRENATAL MULTIVITAMIN CH
1.0000 | ORAL_TABLET | Freq: Every day | ORAL | Status: DC
  Administered 2021-07-01 – 2021-07-03 (×3): 1 via ORAL
  Filled 2021-07-01 (×3): qty 1

## 2021-07-01 MED ORDER — WITCH HAZEL-GLYCERIN EX PADS: 1.0000 "application " | MEDICATED_PAD | CUTANEOUS | Status: DC | PRN

## 2021-07-01 MED ORDER — DEXAMETHASONE SODIUM PHOSPHATE 10 MG/ML IJ SOLN
INTRAMUSCULAR | Status: DC | PRN
  Administered 2021-07-01: 10 mg via INTRAVENOUS

## 2021-07-01 MED ORDER — HYDROMORPHONE HCL 1 MG/ML IJ SOLN: 0.5000 mg | INTRAMUSCULAR | Status: DC | PRN

## 2021-07-01 MED ORDER — KETOROLAC TROMETHAMINE 30 MG/ML IJ SOLN
INTRAMUSCULAR | Status: AC
  Filled 2021-07-01: qty 1

## 2021-07-01 MED ORDER — TETANUS-DIPHTH-ACELL PERTUSSIS 5-2.5-18.5 LF-MCG/0.5 IM SUSY: 0.5000 mL | PREFILLED_SYRINGE | Freq: Once | INTRAMUSCULAR | Status: DC

## 2021-07-01 MED ORDER — PHENYLEPHRINE 40 MCG/ML (10ML) SYRINGE FOR IV PUSH (FOR BLOOD PRESSURE SUPPORT)
PREFILLED_SYRINGE | INTRAVENOUS | Status: DC | PRN
  Administered 2021-07-01 (×2): 40 ug via INTRAVENOUS

## 2021-07-01 MED ORDER — AMISULPRIDE (ANTIEMETIC) 5 MG/2ML IV SOLN: 10.0000 mg | Freq: Once | INTRAVENOUS | Status: DC | PRN

## 2021-07-01 MED ORDER — OXYCODONE HCL 5 MG PO TABS: 5.0000 mg | ORAL_TABLET | Freq: Once | ORAL | Status: DC | PRN

## 2021-07-01 MED ORDER — SODIUM CHLORIDE 0.9 % IV SOLN
INTRAVENOUS | Status: AC
  Filled 2021-07-01: qty 500

## 2021-07-01 MED ORDER — POLYSACCHARIDE IRON COMPLEX 150 MG PO CAPS
150.0000 mg | ORAL_CAPSULE | Freq: Every day | ORAL | Status: DC
  Administered 2021-07-02 – 2021-07-03 (×2): 150 mg via ORAL
  Filled 2021-07-01 (×2): qty 1

## 2021-07-01 MED ORDER — MORPHINE SULFATE (PF) 0.5 MG/ML IJ SOLN
INTRAMUSCULAR | Status: AC
  Filled 2021-07-01: qty 10

## 2021-07-01 MED ORDER — PHENYLEPHRINE HCL-NACL 20-0.9 MG/250ML-% IV SOLN
INTRAVENOUS | Status: DC | PRN
  Administered 2021-07-01: 60 ug/min via INTRAVENOUS

## 2021-07-01 MED ORDER — KETOROLAC TROMETHAMINE 30 MG/ML IJ SOLN
30.0000 mg | Freq: Four times a day (QID) | INTRAMUSCULAR | Status: AC
  Administered 2021-07-01 (×3): 30 mg via INTRAVENOUS
  Filled 2021-07-01 (×4): qty 1

## 2021-07-01 MED ORDER — MENTHOL 3 MG MT LOZG: 1.0000 | LOZENGE | OROMUCOSAL | Status: DC | PRN

## 2021-07-01 MED ORDER — ONDANSETRON HCL 4 MG/2ML IJ SOLN
INTRAMUSCULAR | Status: AC
  Filled 2021-07-01: qty 2

## 2021-07-01 MED ORDER — MEPERIDINE HCL 25 MG/ML IJ SOLN: 6.2500 mg | INTRAMUSCULAR | Status: DC | PRN

## 2021-07-01 MED ORDER — ACETAMINOPHEN 500 MG PO TABS
1000.0000 mg | ORAL_TABLET | Freq: Four times a day (QID) | ORAL | Status: DC
  Administered 2021-07-01 – 2021-07-03 (×9): 1000 mg via ORAL
  Filled 2021-07-01 (×9): qty 2

## 2021-07-01 MED ORDER — COCONUT OIL OIL
1.0000 "application " | TOPICAL_OIL | Status: DC | PRN
  Administered 2021-07-02: 1 via TOPICAL

## 2021-07-01 SURGICAL SUPPLY — 31 items
BENZOIN TINCTURE PRP APPL 2/3 (GAUZE/BANDAGES/DRESSINGS) ×2 IMPLANT
CHLORAPREP W/TINT 26ML (MISCELLANEOUS) ×2 IMPLANT
CLAMP CORD UMBIL (MISCELLANEOUS) IMPLANT
CLOTH BEACON ORANGE TIMEOUT ST (SAFETY) ×2 IMPLANT
CLSR STERI-STRIP ANTIMIC 1/2X4 (GAUZE/BANDAGES/DRESSINGS) ×2 IMPLANT
DRSG OPSITE POSTOP 4X10 (GAUZE/BANDAGES/DRESSINGS) ×2 IMPLANT
ELECT REM PT RETURN 9FT ADLT (ELECTROSURGICAL) ×2
ELECTRODE REM PT RTRN 9FT ADLT (ELECTROSURGICAL) ×1 IMPLANT
EXTRACTOR VACUUM KIWI (MISCELLANEOUS) IMPLANT
GLOVE BIOGEL PI IND STRL 7.0 (GLOVE) ×3 IMPLANT
GLOVE BIOGEL PI INDICATOR 7.0 (GLOVE) ×3
GLOVE ECLIPSE 6.5 STRL STRAW (GLOVE) ×2 IMPLANT
GOWN STRL REUS W/TWL LRG LVL3 (GOWN DISPOSABLE) ×4 IMPLANT
KIT ABG SYR 3ML LUER SLIP (SYRINGE) IMPLANT
LIGASURE IMPACT 36 18CM CVD LR (INSTRUMENTS) ×2 IMPLANT
NEEDLE HYPO 25X5/8 SAFETYGLIDE (NEEDLE) IMPLANT
NS IRRIG 1000ML POUR BTL (IV SOLUTION) ×2 IMPLANT
PACK C SECTION WH (CUSTOM PROCEDURE TRAY) ×2 IMPLANT
PAD OB MATERNITY 4.3X12.25 (PERSONAL CARE ITEMS) ×2 IMPLANT
STRIP CLOSURE SKIN 1/2X4 (GAUZE/BANDAGES/DRESSINGS) IMPLANT
SUT MNCRL 0 VIOLET CTX 36 (SUTURE) ×2 IMPLANT
SUT MONOCRYL 0 CTX 36 (SUTURE) ×2
SUT PLAIN 0 NONE (SUTURE) IMPLANT
SUT PLAIN 2 0 (SUTURE) ×1
SUT PLAIN ABS 2-0 CT1 27XMFL (SUTURE) ×1 IMPLANT
SUT VIC AB 0 CT1 27 (SUTURE) ×1
SUT VIC AB 0 CT1 27XBRD ANBCTR (SUTURE) ×1 IMPLANT
SUT VIC AB 4-0 KS 27 (SUTURE) ×2 IMPLANT
TOWEL OR 17X24 6PK STRL BLUE (TOWEL DISPOSABLE) ×2 IMPLANT
TRAY FOLEY W/BAG SLVR 14FR LF (SET/KITS/TRAYS/PACK) IMPLANT
WATER STERILE IRR 1000ML POUR (IV SOLUTION) ×2 IMPLANT

## 2021-07-01 NOTE — Progress Notes (Signed)
Interval Note  POD#0 s/p primary c/s NRFHT   Acute on chronic blood loss anemia. Postop Hgb drop from 9.6 on admission to 7.4 this morning. Patient seen at bedside, pale but well appearing. She denies any dizziness or pain. Feeling tired but did not sleep much after 1AM cesarean section. Per RN patient did have +orthostatics but was not symptomatic with it. UOP adequate, 500 cc clear since arriving to unit. Will give IV Venofer now since asymptomatic and continue to monitor. Will start PO Iron therapy tomorrow.   Aroldo Galli A Merly Hinkson 07/01/21 11:45 AM

## 2021-07-01 NOTE — Lactation Note (Signed)
This note was copied from a baby's chart. Lactation Consultation Note  Patient Name: Anne Underwood [id number] Date: 07/01/2021 Reason for consult: Initial assessment;Term;Primapara;1st time breastfeeding Age:26 hours  P1 mother whose infant is now 46 hours old.  This is a term baby at 39+5 weeks.  Baby was swaddled and in visitor's arms when I arrived.  Reviewed breast feeding basics with family.  Mother has been able to hand express colostrum and father is finger feeding drops to baby.    Mother will feed 8-12 times/24 hours or sooner if baby shows cues.  Encouraged her to call her RN/LC for latch assistance as needed.  Baby has voided; no stool yet.  Mom made aware of O/P services, breastfeeding support groups, community resources, and our phone # for post-discharge questions.  Mother has a DEBP for home use and is planning on obtaining a wireless pump for her return to work in 8 weeks.  Father and family members present.   Maternal Data Has patient been taught Hand Expression?: Yes Does the patient have breastfeeding experience prior to this delivery?: No  Feeding Mother's Current Feeding Choice: Breast Milk  LATCH Score                    Lactation Tools Discussed/Used    Interventions    Discharge Pump: Personal WIC Program: No  Consult Status Consult Status: Follow-up Date: 07/02/21 Follow-up type: In-patient    Cade Dashner R Ilan Kahrs 07/01/2021, 9:21 AM

## 2021-07-01 NOTE — Transfer of Care (Signed)
Immediate Anesthesia Transfer of Care Note  Patient: Anne Underwood  Procedure(s) Performed: CESAREAN SECTION  Patient Location: PACU  Anesthesia Type:Epidural  Level of Consciousness: awake, alert  and oriented  Airway & Oxygen Therapy: Patient Spontanous Breathing  Post-op Assessment: Report given to RN and Post -op Vital signs reviewed and stable  Post vital signs: Reviewed and stable  Last Vitals:  Vitals Value Taken Time  BP 110/58 07/01/21 0208  Temp    Pulse 99 07/01/21 0214  Resp 14 07/01/21 0214  SpO2 100 % 07/01/21 0214  Vitals shown include unvalidated device data.  Last Pain:  Vitals:   07/01/21 0028  TempSrc:   PainSc: 0-No pain         Complications: No notable events documented.

## 2021-07-01 NOTE — Anesthesia Postprocedure Evaluation (Signed)
Anesthesia Post Note  Patient: Anne Underwood  Procedure(s) Performed: CESAREAN SECTION     Patient location during evaluation: PACU Anesthesia Type: Epidural Level of consciousness: awake Pain management: pain level controlled Vital Signs Assessment: post-procedure vital signs reviewed and stable Respiratory status: spontaneous breathing, nonlabored ventilation and respiratory function stable Cardiovascular status: stable Postop Assessment: no headache, no backache and epidural receding Anesthetic complications: no   No notable events documented.  Last Vitals:  Vitals:   07/01/21 0352 07/01/21 0512  BP: 106/60 103/69  Pulse: 77 80  Resp:    Temp: 36.7 C 36.9 C  SpO2: 97% 94%    Last Pain:  Vitals:   07/01/21 0443  TempSrc:   PainSc: 5    Pain Goal:                   Leonides Grills

## 2021-07-01 NOTE — Lactation Note (Signed)
This note was copied from a baby's chart. Lactation Consultation Note  Patient Name: Anne Underwood [id number] Date: 07/01/2021 Reason for consult: Follow-up assessment;Term;Primapara;1st time breastfeeding Age:26 hours  P1 mother whose infant is now 3 hours old.  This is a term baby at 39+5 weeks.  Mother requested latch assistance.  Baby "Anne Underwood" sleeping on mother's chest when I arrived.  Mother interested in attempting to latch.  Awakened baby by changing a diaper and finger feeding colostrum drops.  Assisted to latch in the cross cradle position on the left breast.  With constant gentle stimulation "Anne Underwood" fed for 10 minutes.  Reviewed basics with parents during the feeding.  Showed father how he can assist when needed.  Placed baby STS after feeding with blanket to cover.  Mother appreciative.  All questions answered.  Father supportive.   Maternal Data    Feeding Mother's Current Feeding Choice: Breast Milk  LATCH Score Latch: Repeated attempts needed to sustain latch, nipple held in mouth throughout feeding, stimulation needed to elicit sucking reflex.  Audible Swallowing: None  Type of Nipple: Everted at rest and after stimulation  Comfort (Breast/Nipple): Soft / non-tender  Hold (Positioning): Assistance needed to correctly position infant at breast and maintain latch.  LATCH Score: 6   Lactation Tools Discussed/Used    Interventions Interventions: Breast feeding basics reviewed;Assisted with latch;Skin to skin;Breast massage;Hand express;Breast compression;Position options;Support pillows;Adjust position;Education  Discharge    Consult Status Consult Status: Follow-up Date: 07/02/21 Follow-up type: In-patient    Anne Underwood 07/01/2021, 5:16 PM

## 2021-07-01 NOTE — Op Note (Signed)
Anne Underwood Anne Underwood [birth year] [id number]  OPERATIVE NOTE  PROCEDURE: primary low transverse cesarean section  PRE-OPERATIVE DIAGNOSIS:  Single intrauterine pregnancy at 39 weeks 5 days Non-reassuring fetal heart tracing  POST-OPERATIVE DIAGNOSIS: Single intrauterine pregnancy at 39 weeks 5 days, delivered Non-reassuring fetal heart tracing  SURGEON: Clance Boll, DO  ASSISTANT: Dorisann Frames, CNM  FINDINGS: normal female gravid anatomy including uterus, bilateral fallopian tubes, and bilateral ovaries. Single healthy viable female infant in the vertex direct OP presentation apgars 8 and 10 at the 1 and 5 minutes respectively, weight 8 pounds 9 ounces  EBL: 581 cc  FLUIDS: per anesthesia  MEDICATIONS: Ancef 2 g, Azithromycin 500 mg  COMPLICATIONS: None  PROCEDURE IN DETAIL:  After the patient was appropriately consented she was taken to the operating room where regional anesthesia was obtained without complications. The patient was placed in the dorsal supine position with leftward tilt. Fetal heart tones were obtained and found to be reassuring. A Foley catheter had previously been placed and remained in place for the duration of the surgery. The patient was prepped and draped in the usual sterile fashion. An appropriate time out was performed that verified the correct patient, procedure, and surgical team.   The scalpel was used to make a low transverse skin incision. The incision was carried down to the fascia, maintaining hemostasis with the Bovie as needed, The fascia was incised to the left and the right of the midline. The fascia incision was carried laterally using the curved Mayo scissors on either side. The inferior aspect of the fascia was grasped with the Kocher clamps, tented upwards, and the underlying rectus muscle dissected off bluntly and sharply with curved Mayo scissors. The Kocher clamps were removed, placed on the superior aspect of the fascia and the rectus muscles  dissected off in a similar fashion. The rectus muscles were divided at the midline bluntly. The peritoneum was identified, grasped with hemostat clamps and entered sharply with the Metzenbaum scissors. The incision was then extended laterally by bluntly stretching. The Alexis retractor was introduced. The vesicouterine peritoneum was dissected off the lower uterine segment and a bladder flap was created digitally. The scalpel was used to make a low transverse uterine incision. The incision was extended caudad and cephalad by bluntly stretching. The amniotic membranes were ruptured for clear fluid. The infant was found to be in the cephalic presentation. The infant's head was delivered through the hysterotomy while maintaining adequate flexion at the neck. The infant's remaining shoulders and body were subsequently delivered without difficulties. The infant was dried, stimulated, and handed off to the awaiting neonatal team. The placenta was delivered spontaneously intact by manual extraction. The uterine cavity was cleared of any clot and debris. The hysterotomy was closed using 0-Monocryl in a running locking fashion. A second layer closure was performed using the same suture. Adequate hemostasis of the hysterotomy was noted. The bilateral gutters were cleared of all clot and debris. Bilateral tubes and ovaries were inspected and found to be within normal limits. The underlying fascia planes were inspected and found to be hemostatic. The fascia was closed with 0-Vicryl a normal running fashion. The subcutaneous layer was irrigated with sterile saline and hemostasis obtained with the Bovie. The subcutaneous layer was closed with 2-0 plain. The skin layer was closed with 4-0 Vicryl. The patient tolerated the procedure well and was taken to the recovery room in stable condition. All instrument, needle, and sponge counts were correct.   Anne Underwood [name] 07/01/21 1:59 AM

## 2021-07-02 MED ORDER — IBUPROFEN 600 MG PO TABS
600.0000 mg | ORAL_TABLET | Freq: Four times a day (QID) | ORAL | Status: DC
  Administered 2021-07-02 – 2021-07-03 (×6): 600 mg via ORAL
  Filled 2021-07-02 (×6): qty 1

## 2021-07-02 NOTE — Lactation Note (Signed)
This note was copied from a baby's chart. Lactation Consultation Note  Patient Name: Anne Underwood Date: 07/02/2021 Reason for consult: Follow-up assessment;Term;Primapara;1st time breastfeeding Age:26 hours   P1 mother whose infant is now 29 hours old.  This is a term baby at 39+5 weeks.   Mother's current feeding plan is breast/formula.  Mother had no questions/concerns related to breast feeding.  Reviewed feeding plan for today.  Mother has been supplementing due to needing reassurance that baby is "getting enough."  Reviewed "supply and demand" and encouraged baby to latch/feed whenever she shows feeding cues. Last LATCH score was an 8; voiding/stooling well.  Mother may call for assistance as needed.    Father and visitor present.     Maternal Data Has patient been taught Hand Expression?: Yes Does the patient have breastfeeding experience prior to this delivery?: No  Feeding Mother's Current Feeding Choice: Breast Milk and Formula  LATCH Score                    Lactation Tools Discussed/Used    Interventions Interventions: Breast feeding basics reviewed;Education  Discharge Pump: Personal WIC Program: No  Consult Status Consult Status: Follow-up Date: 07/03/21 Follow-up type: In-patient    Dora Sims 07/02/2021, 12:49 PM

## 2021-07-02 NOTE — Progress Notes (Signed)
Subjective: Postpartum Day 1: Cesarean Delivery Patient doing very well. Pain well controlled with current medications. Has been up and ambulating without dizziness. Minimal lochia. Spontaneously voiding. Pass flatus, no BM yet. Tolerating regular diet, denies N/V. Denies any HA, CP, SOB.   Baby girl doing well at bedside. Working with lactation on BF  Objective: Patient Vitals for the past 24 hrs:  BP Temp Temp src Pulse Resp SpO2  07/02/21 0620 101/69 98.3 F (36.8 C) Oral 75 18 99 %  07/01/21 2000 (!) 102/58 98.5 F (36.9 C) Oral 78 18 97 %  07/01/21 1608 103/65 97.9 F (36.6 C) Oral 71 17 99 %    Intake/Output Summary (Last 24 hours) at 07/02/2021 1143 Last data filed at 07/02/2021 0300 Gross per 24 hour  Intake 0 ml  Output 800 ml  Net -800 ml     Physical Exam:  General: alert and no distress Lochia: appropriate Uterine Fundus: firm Incision: healing well, no significant drainage DVT Evaluation: No evidence of DVT seen on physical exam.  Recent Labs    06/30/21 0004 07/01/21 0450  HGB 9.6* 7.4*  HCT 30.6* 23.7*    Assessment/Plan: Status post Cesarean section. Doing well postoperatively.  Continue current care. Bona Izola Price [id number] POD#1 sp primary cesarean section at [redacted]w[redacted]d for NRFHT 1. PPC: cont current pain regimen per ERAS protocol, encourage ambulation, regular diet as tolerated 2. Acute on chronic blood loss anemia. S/p IV Venofer 11/5, started on PO Fe today. Asymptomatic cont to monitor 3. Lactation consult PRN 4. Hx anxiety- appropriate mood and affect, well supported, cont to monitor for sxs of PPD 5. RH POS, Rubella IMM 6. Continue routine postpartum care, anticipate D/C home tmw POD2  Miloh Alcocer A Kahmari Koller 07/02/2021, 11:40 AM

## 2021-07-03 MED ORDER — ACETAMINOPHEN 500 MG PO TABS: 1000.0000 mg | ORAL_TABLET | Freq: Four times a day (QID) | ORAL | 0 refills | Status: DC

## 2021-07-03 MED ORDER — IBUPROFEN 600 MG PO TABS: 600.0000 mg | ORAL_TABLET | Freq: Four times a day (QID) | ORAL | 0 refills | Status: DC

## 2021-07-03 MED ORDER — POLYSACCHARIDE IRON COMPLEX 150 MG PO CAPS: 150.0000 mg | ORAL_CAPSULE | Freq: Every day | ORAL | 0 refills | Status: DC

## 2021-07-03 MED ORDER — SENNOSIDES-DOCUSATE SODIUM 8.6-50 MG PO TABS: 2.0000 | ORAL_TABLET | Freq: Every evening | ORAL | 0 refills | Status: DC | PRN

## 2021-07-03 MED ORDER — COCONUT OIL OIL: 1.0000 "application " | TOPICAL_OIL | 0 refills | Status: DC | PRN

## 2021-07-03 NOTE — Social Work (Signed)
CSW received consult for hx of Anxiety. CSW met with MOB to offer support and complete assessment.    CSW met with MOB at bedside and introduced CSW role. CSW observed MOB up in the room and infant asleep in bassinet. MOB presented calm and welcomed CSW visit. CSW inquired how MOB has felt since giving birth. MOB reported feeling much better now since giving birth. MOB disclosed during the pregnancy emotionally it was rough due to anxiety. CSW inquired MOB history of anxiety. MOB disclosed she was diagnosed with anxiety about five years ago. 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 MOB reported she was previously on the medication Cymbalta for anxiety but discontinued it during the pregnancy. She plans to see how she copes without medication before resuming it. MOB reported her spouse, parents, in-laws, and co-workers have all been great supports. CSW provided education regarding the baby blues period vs. perinatal mood disorders, discussed treatment and gave resources for mental health follow up if concerns arise. CSW recommended MOB complete a self-evaluation during the postpartum time period using the New Mom Checklist from Postpartum Progress and encouraged MOB to contact a medical professional if symptoms are noted at any time. MOB shared she feels comfortable reaching out to her doctor if she has concerns. MOB denied SI/HI.   CSW provided review of Sudden Infant Death Syndrome (SIDS) precautions. MOB reported she has essential items for the infant including a bassinet where the infant will sleep. MOB has chosen [facility] for infants follow up care. CSW assessed MOB for additional needs. MOB reported no further needs.   CSW identifies no further need for intervention and no barriers to discharge at this time.   Kathrin Greathouse, MSW, LCSW [facility] [place] Worker  [phone] 07/03/2021  10:29 AM

## 2021-07-03 NOTE — Discharge Summary (Signed)
Postpartum Discharge Summary  Date of Service updated 07/03/21     Patient Name: Anne Underwood DOB: 07-Nov-1995 MRN: [id number]  Date of admission: 06/30/2021 Delivery date:07/01/2021  Delivering provider: Langley Gauss A  Date of discharge: 07/03/2021  Admitting diagnosis: Encounter for induction of labor [Z34.90] Intrauterine pregnancy: [redacted]w[redacted]d    Secondary diagnosis:  Principal Problem:   Postpartum care following cesarean delivery 11/5 Active Problems:   Generalized anxiety disorder   PAC (premature atrial contraction)   Encounter for induction of labor   Maternal anemia in pregnancy, antepartum - IDA   Status post primary low transverse cesarean section - NRFHTs   Iron deficiency anemia  Additional problems:     Discharge diagnosis: Term Pregnancy Delivered and Anemia                                              Post partum procedures: IV Venofer x 1 Augmentation: AROM, Pitocin, and Cytotec Complications: None  Hospital course: Induction of Labor With Cesarean Section   25y.o. yo G1P1001 at 365w5das admitted to the hospital 06/30/2021 for induction of labor - elective at term. Patient had a labor course significant for Cytotec, Pitocin and AROM. She had recurrent decelerations. The patient went for cesarean section due to Non-Reassuring FHR. Delivery details are as follows: Membrane Rupture Time/Date: 5:00 PM ,06/30/2021   Delivery Method:C-Section, Low Transverse  Details of operation can be found in separate operative Note.  Patient had an uncomplicated postpartum course. She is ambulating, tolerating a regular diet, passing flatus, and urinating well.  Patient is discharged home in stable condition on 07/03/21.      Newborn Data: Birth date:07/01/2021  Birth time:1:11 AM  Gender:Female  Living status:Living  Apgars:8 ,10  Weight:3870 g                                Magnesium Sulfate received: No BMZ received: No Rhophylac:N/A MMR:N/A T-DaP:Given  prenatally Flu: ? Transfusion:No  Physical exam  Vitals:   07/02/21 0620 07/02/21 1400 07/02/21 2215 07/03/21 0556  BP: 101/69 104/67 110/73 107/67  Pulse: 75 87 82 79  Resp: 18 15 17 18   Temp: 98.3 F (36.8 C) 98 F (36.7 C) 98.2 F (36.8 C) 97.8 F (36.6 C)  TempSrc: Oral Oral Oral Oral  SpO2: 99% 98% 97% 100%  Weight:      Height:       General: alert, cooperative, and no distress Heart: RRR Lungs: clear, equal bilaterally  Lochia: appropriate Uterine Fundus: firm, below umbilicus Incision: Healing well with no significant drainage, No significant erythema, Dressing is clean, dry, and intact DVT Evaluation: No evidence of DVT seen on physical exam. No cords or calf tenderness. Calf/Ankle edema is present Labs: Lab Results  Component Value Date   WBC 18.1 (H) 07/01/2021   HGB 7.4 (L) 07/01/2021   HCT 23.7 (L) 07/01/2021   MCV 77.7 (L) 07/01/2021   PLT 183 07/01/2021   CMP Latest Ref Rng & Units 06/11/2017  Glucose 65 - 99 mg/dL 71  BUN 7 - 25 mg/dL 9  Creatinine 0.50 - 1.10 mg/dL 0.74  Sodium 135 - 146 mmol/L 138  Potassium 3.5 - 5.3 mmol/L 4.3  Chloride 98 - 110 mmol/L 105  CO2 20 - 32 mmol/L 23  Calcium 8.6 - 10.2 mg/dL 8.7  Total Protein 6.1 - 8.1 g/dL -  Total Bilirubin 0.2 - 1.2 mg/dL -  Alkaline Phos 33 - 115 U/L -  AST 10 - 30 U/L -  ALT 6 - 29 U/L -   Edinburgh Score: Edinburgh Postnatal Depression Scale Screening Tool 07/03/2021  I have been able to laugh and see the funny side of things. 0  I have looked forward with enjoyment to things. 0  I have blamed myself unnecessarily when things went wrong. 0  I have been anxious or worried for no good reason. 2  I have felt scared or panicky for no good reason. 0  Things have been getting on top of me. 0  I have been so unhappy that I have had difficulty sleeping. 0  I have felt sad or miserable. 0  I have been so unhappy that I have been crying. 0  The thought of harming myself has occurred to me. 0   Edinburgh Postnatal Depression Scale Total 2      After visit meds:  Allergies as of 07/03/2021   No Known Allergies      Medication List     TAKE these medications    acetaminophen 500 MG tablet Commonly known as: TYLENOL Take 2 tablets (1,000 mg total) by mouth every 6 (six) hours.   coconut oil Oil Apply 1 application topically as needed.   ibuprofen 600 MG tablet Commonly known as: ADVIL Take 1 tablet (600 mg total) by mouth every 6 (six) hours.   iron polysaccharides 150 MG capsule Commonly known as: NIFEREX Take 1 capsule (150 mg total) by mouth daily. Start taking on: July 04, 2021   Prenatal Complete 14-0.4 MG Tabs Take 1 tablet by mouth daily.   senna-docusate 8.6-50 MG tablet Commonly known as: Senokot-S Take 2 tablets by mouth at bedtime as needed for mild constipation.         Discharge home in stable condition Infant Feeding: Bottle and Breast Infant Disposition:home with mother Discharge instruction: per After Visit Summary and Postpartum booklet. Activity: Advance as tolerated. Pelvic rest for 6 weeks.  Diet: low salt diet Anticipated Birth Control:  not discussed Postpartum Appointment:6 weeks Additional Postpartum F/U: Postpartum Depression checkup in 2 weeks Future Appointments: Future Appointments  Date Time Provider [place]  11/24/2021  8:00 AM Werner Lean, MD [facility]   Follow up Visit:  Follow-up Information     Law, Cassandra A, DO Follow up in 2 week(s).   Specialty: Obstetrics and Gynecology Why: Postpartum mood check, then 6 week PP visit Contact information: [place] [address] [phone]                     07/03/2021 Darliss Cheney, CNM

## 2021-07-15 ENCOUNTER — Telehealth (HOSPITAL_COMMUNITY): Payer: Self-pay

## 2021-07-15 NOTE — Telephone Encounter (Signed)
No answer. Left message to return nurse call.  Marcelino Duster [facility] 07/15/2021,1535

## 2021-07-18 ENCOUNTER — Telehealth: Payer: Self-pay | Admitting: Family Medicine

## 2021-07-18 NOTE — Telephone Encounter (Signed)
She can take tylenol, advil, antihistamines, and plain mucinex.

## 2021-07-18 NOTE — Telephone Encounter (Signed)
lmtcb

## 2021-11-23 NOTE — Progress Notes (Deleted)
?Cardiology Office Note:   ? ?Date:  11/23/2021  ? ?ID:  Anne Underwood, DOB Jun 09, 1996, MRN [id number] ? ?PCP:  Gabriel Earing, FNP ?  ?[facility] Providers ?Cardiologist:  Christell Constant, MD    ? ?Referring MD: Gabriel Earing, FNP  ? ?CC: Heart racing f/u ? ?History of Present Illness:   ? ?Anne Underwood is a 26 y.o. female with a hx of palpitations in the setting of pregnancy seen 05/10/21.  Had rare PVCs on monitor. ? ?Baby girl *** ? ?Patient notes that she is doing ***.   ?Since day prior/last visit notes *** . ?There are no*** interval hospital/ED visit.   ? ?No chest pain or pressure ***.  No SOB/DOE*** and no PND/Orthopnea***.  No weight gain or leg swelling***.  No palpitations or syncope ***. ? ?Ambulatory blood pressure ***. ? ? ?Past Medical History:  ?Diagnosis Date  ? ADD (attention deficit disorder)   ? Anxiety   ? Family history of adverse reaction to anesthesia   ? MOTHER HAD PONV  ? Ovarian cyst, follicular 10/14/2014  ? Pelvic pain in female 10/14/2014  ? ? ?Past Surgical History:  ?Procedure Laterality Date  ? CESAREAN SECTION N/A 07/01/2021  ? Procedure: CESAREAN SECTION;  Surgeon: Toy Baker, DO;  Location: [facility];  Service: Obstetrics;  Laterality: N/A;  ? COLONOSCOPY N/A 07/04/2018  ? Procedure: COLONOSCOPY;  Surgeon: West Bali, MD;  Location: [facility] SU[facility];  Service: Endoscopy;  Laterality: N/A;  2:00pm  ? LAPAROSCOPY N/A 10/15/2014  ? Procedure: LAPAROSCOPY OPERATIVE;  Surgeon: Sherian Rein, MD;  Location: [facility] ORS;  Service: Gynecology;  Laterality: N/A;  ? OVARIAN CYST REMOVAL Right 10/15/2014  ? Procedure: OVARIAN CYSTECTOMY;  Surgeon: Sherian Rein, MD;  Location: [facility] ORS;  Service: Gynecology;  Laterality: Right;  Dr only needs 1hr OR time  ? WISDOM TOOTH EXTRACTION    ? ? ?Current Medications: ?No outpatient medications have been marked as taking for the 11/24/21 encounter (Appointment) with Christell Constant, MD.  ?  ? ?Allergies:   Patient  has no known allergies.  ? ?Social History  ? ?Socioeconomic History  ? Marital status: Single  ?  Spouse name: Not on file  ? Number of children: 0  ? Years of education: 3  ? Highest education level: Bachelor's degree (e.g., BA, AB, BS)  ?Occupational History  ? Occupation: Child psychotherapist  ?  Employer: [employer]  ?Tobacco Use  ? Smoking status: Never  ? Smokeless tobacco: Never  ?Vaping Use  ? Vaping Use: Never used  ?Substance and Sexual Activity  ? Alcohol use: Not Currently  ? Drug use: No  ? Sexual activity: Yes  ?  Birth control/protection: None  ?  Comment: Current Pregnancy  ?Other Topics Concern  ? Not on file  ?Social History Narrative  ? [employer]  ? ?Social Determinants of Health  ? ?Financial Resource Strain: Not on file  ?Food Insecurity: Not on file  ?Transportation Needs: Not on file  ?Physical Activity: Not on file  ?Stress: Not on file  ?Social Connections: Not on file  ?  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 ?Family History: ?The patient's family history includes COPD in her maternal grandfather; Cancer (age of onset: 50) in her mother; Healthy in her father and mother; Heart attack in her paternal grandfather. There is no history of Colon cancer or Colon polyps. ?Grandfather and aunt (dad's side) had SCD in 30s  and 50s. ? ?ROS:   ?Please see the history of present illness.    ? All other systems reviewed and are negative. ? ?EKGs/Labs/Other Studies Reviewed:   ? ?The following studies were reviewed today: ? ?EKG:  EKG is  ordered today.  The ekg ordered today demonstrates  ?05/09/21: SR 92 LVH ? ?Cardiac Event Monitoring: ?Date: 07/04/2017 ?Results: ?SR and sinus tachycardia- 10 events ?Minimal PACs and PVCs ? ?Transthoracic Echocardiogram: ?Date: 06/28/2017 ?Results: ?- Left ventricle: The cavity size was normal. Wall thickness was  ?  normal. Systolic function was normal. The estimated ejection  ?  fraction was in the range of 50% to 55%. Wall motion was normal;  ?   there were no regional wall motion abnormalities. Left  ?  ventricular diastolic function parameters were normal.  ?- Aortic valve: Valve area (VTI): 2.39 cm^2. Valve area (Vmax):  ?  2.36 cm^2.  ?- Technically adequate study. ? ? ?Recent Labs: ?07/01/2021: Hemoglobin 7.4; Platelets 183  ?Recent Lipid Panel ?No results found for: CHOL, TRIG, HDL, CHOLHDL, VLDL, LDLCALC, LDLDIRECT ? ?    ? ?Physical Exam:   ? ?VS:  There were no vitals taken for this visit.   ? ?Wt Readings from Last 3 Encounters:  ?06/30/21 205 lb 9.6 oz (93.3 kg)  ?05/10/21 196 lb (88.9 kg)  ?12/01/20 173 lb (78.5 kg)  ?  ? ?Gen: *** distress, *** obese/well nourished/malnourished   ?Neck: No JVD, *** carotid bruit ?Ears: Homero Fellers Sign ?Cardiac: No Rubs or Gallops, *** Murmur, ***cardia, *** radial pulses ?Respiratory: Clear to auscultation bilaterally, *** effort, ***  respiratory rate ?GI: Soft, nontender, non-distended *** ?MS: No *** edema; *** moves all extremities ?Integument: Skin feels *** ?Neuro:  At time of evaluation, alert and oriented to person/place/time/situation *** ?Psych: Normal affect, patient feels *** ? ? ?ASSESSMENT:   ? ?No diagnosis found. ? ?PLAN:   ? ?PVCs ?- no meds PRN f/u ? ? ?Medication Adjustments/Labs and Tests Ordered: ?Current medicines are reviewed at length with the patient today.  Concerns regarding medicines are outlined above.  ?No orders of the defined types were placed in this encounter. ? ?No orders of the defined types were placed in this encounter. ? ? ? ?There are no Patient Instructions on file for this visit.  ? ?Signed, ?Christell Constant, MD  ?11/23/2021 7:09 PM    ?[facility] ? ?

## 2021-11-24 ENCOUNTER — Ambulatory Visit: Payer: Managed Care, Other (non HMO) | Admitting: Internal Medicine

## 2021-11-24 ENCOUNTER — Encounter: Payer: Self-pay | Admitting: Internal Medicine

## 2022-12-24 ENCOUNTER — Encounter: Payer: Self-pay | Admitting: Family Medicine

## 2022-12-24 ENCOUNTER — Ambulatory Visit (INDEPENDENT_AMBULATORY_CARE_PROVIDER_SITE_OTHER): Payer: BC Managed Care – PPO | Admitting: Family Medicine

## 2022-12-24 VITALS — BP 123/73 | HR 90 | Temp 98.0°F | Ht 63.0 in | Wt 187.2 lb

## 2022-12-24 DIAGNOSIS — L720 Epidermal cyst: Secondary | ICD-10-CM | POA: Diagnosis not present

## 2022-12-24 DIAGNOSIS — F411 Generalized anxiety disorder: Secondary | ICD-10-CM | POA: Diagnosis not present

## 2022-12-24 DIAGNOSIS — F321 Major depressive disorder, single episode, moderate: Secondary | ICD-10-CM | POA: Diagnosis not present

## 2022-12-24 MED ORDER — SERTRALINE HCL 100 MG PO TABS: 100.0000 mg | ORAL_TABLET | Freq: Every day | ORAL | 3 refills | Status: DC

## 2022-12-24 NOTE — Progress Notes (Signed)
Established Patient Office Visit  Subjective   Patient ID: Anne Underwood, female    DOB: 1995/12/06  Age: 27 y.o. MRN: [id number]  Chief Complaint  Patient presents with   Anxiety   Headache    Headache  This is a new problem. The current episode started 1 to 4 weeks ago. The problem occurs intermittently. The problem has been unchanged. The pain is located in the Right unilateral and occipital region. The pain does not radiate. The pain quality is not similar to prior headaches. The quality of the pain is described as aching (tender to the touch). The pain is mild. Associated symptoms include scalp tenderness. Pertinent negatives include no abdominal pain, abnormal behavior, back pain, blurred vision, dizziness, drainage, ear pain, eye pain, eye redness, eye watering, facial sweating, fever, hearing loss, loss of balance, nausea, neck pain, numbness, phonophobia, photophobia, rhinorrhea, sinus pressure, tingling, tinnitus, visual change, vomiting, weakness or weight loss.   She reports that anxiety has been uncontrolled for the last few months. Her mood has also been very liable and she has been having a panic attack daily. She is currently on zoloft 50 mg. She has been on the regimin for awhile. At first she felt improvement with this, however it no longer seems to be helping. She is currently breastfeeding.      12/24/2022   11:34 AM 12/01/2020    3:16 PM 10/10/2020    5:03 PM  Depression screen PHQ 2/9  Decreased Interest 3 0 0  Down, Depressed, Hopeless 2 0 0  PHQ - 2 Score 5 0 0  Altered sleeping 0    Tired, decreased energy 0    Change in appetite 3    Feeling bad or failure about yourself  0    Trouble concentrating 3    Moving slowly or fidgety/restless 0    Suicidal thoughts 0    PHQ-9 Score 11    Difficult doing work/chores Very difficult        12/24/2022   11:35 AM 04/14/2020    2:54 PM 03/06/2019    5:18 PM 03/06/2019    4:57 PM  GAD 7 : Generalized Anxiety Score   Nervous, Anxious, on Edge 3 2 2 2   Control/stop worrying 3 2  2   Worry too much - different things 3 2  2   Trouble relaxing 2 2  1   Restless 2 2 2 2   Easily annoyed or irritable 3 1 2 2   Afraid - awful might happen 3 2 1 1   Total GAD 7 Score 19 13  12   Anxiety Difficulty Very difficult Not difficult at all  Somewhat difficult      Past Medical History:  Diagnosis Date   ADD (attention deficit disorder)    Anxiety    Family history of adverse reaction to anesthesia    MOTHER HAD PONV   Ovarian cyst, follicular 10/14/2014   Pelvic pain in female 10/14/2014      Review of Systems  Constitutional:  Negative for fever and weight loss.  HENT:  Negative for ear pain, hearing loss, rhinorrhea, sinus pressure and tinnitus.   Eyes:  Negative for blurred vision, photophobia, pain and redness.  Gastrointestinal:  Negative for abdominal pain, nausea and vomiting.  Musculoskeletal:  Negative for back pain and neck pain.  Neurological:  Positive for headaches. Negative for dizziness, tingling, weakness, numbness and loss of balance.      Objective:     BP 123/73   Pulse 90  Temp 98 F (36.7 C) (Temporal)   Ht 5\' 3"  (1.6 m)   Wt 187 lb 4 oz (84.9 kg)   SpO2 95%   BMI 33.17 kg/m    Physical Exam Vitals and nursing note reviewed.  Constitutional:      General: She is not in acute distress.    Appearance: She is well-developed. She is not ill-appearing, toxic-appearing or diaphoretic.  HENT:     Head: Normocephalic and atraumatic.      Comments: 1.5 cm mobile cyst to right occipital scalp. No erythema or exudate.  Cardiovascular:     Rate and Rhythm: Normal rate and regular rhythm.     Heart sounds: Normal heart sounds. No murmur heard. Pulmonary:     Effort: Pulmonary effort is normal. No respiratory distress.     Breath sounds: Normal breath sounds.  Musculoskeletal:     Cervical back: Neck supple. No rigidity.     Right lower leg: No edema.     Left lower leg: No  edema.  Skin:    General: Skin is warm and dry.  Neurological:     General: No focal deficit present.     Mental Status: She is alert and oriented to person, place, and time.  Psychiatric:        Mood and Affect: Mood normal.        Speech: Speech normal.        Behavior: Behavior normal.        Thought Content: Thought content normal.        Judgment: Judgment normal.      No results found for any visits on 12/24/22.    The ASCVD Risk score (Arnett DK, et al., 2019) failed to calculate for the following reasons:   The 2019 ASCVD risk score is only valid for ages 50 to 39    Assessment & Plan:   [name] was seen today for anxiety and headache.  Diagnoses and all orders for this visit:  Epidermoid cyst of skin of scalp Referral to dermatology due to tenderness for the last few weeks.  -     Ambulatory referral to Dermatology  Generalized anxiety disorder Depression, major, single episode, moderate (HCC) Uncontrolled. Denies SI. Increase zoloft to 100 mg daily.  -     sertraline (ZOLOFT) 100 MG tablet; Take 1 tablet (100 mg total) by mouth daily.  Return in about 6 weeks (around 02/04/2023) for medication follow up.  The patient indicates understanding of these issues and agrees with the plan.     Gabriel Earing, FNP

## 2023-02-04 ENCOUNTER — Ambulatory Visit: Payer: BC Managed Care – PPO | Admitting: Family Medicine

## 2023-02-04 VITALS — BP 98/67 | HR 78 | Temp 97.6°F | Wt 181.0 lb

## 2023-02-04 DIAGNOSIS — F411 Generalized anxiety disorder: Secondary | ICD-10-CM

## 2023-02-04 DIAGNOSIS — F321 Major depressive disorder, single episode, moderate: Secondary | ICD-10-CM | POA: Diagnosis not present

## 2023-02-04 NOTE — Progress Notes (Signed)
Established Patient Office Visit  Subjective   Patient ID: Anne Underwood, female    DOB: February 06, 1996  Age: 27 y.o. MRN: [id number]  Chief Complaint  Patient presents with   Follow-up    6 wk f/u medication increase    HPI Anne Underwood is here for a follow up of anxiety and depression after her zoloft dosage was increase 6 weeks ago. She reprots a significant improvement in her symptoms. She feels much better. She denies side effects. She still isn't sleeping great, but this is primarily due to weaning her toddler from breastfeeding.      02/04/2023    1:38 PM 12/24/2022   11:34 AM 12/01/2020    3:16 PM  Depression screen PHQ 2/9  Decreased Interest 0 3 0  Down, Depressed, Hopeless 0 2 0  PHQ - 2 Score 0 5 0  Altered sleeping 0 0   Tired, decreased energy 0 0   Change in appetite 0 3   Feeling bad or failure about yourself  0 0   Trouble concentrating 0 3   Moving slowly or fidgety/restless 0 0   Suicidal thoughts 0 0   PHQ-9 Score 0 11   Difficult doing work/chores  Very difficult       02/04/2023    1:39 PM 12/24/2022   11:35 AM 04/14/2020    2:54 PM 03/06/2019    5:18 PM  GAD 7 : Generalized Anxiety Score  Nervous, Anxious, on Edge 1 3 2 2   Control/stop worrying 0 3 2   Worry too much - different things 1 3 2    Trouble relaxing 0 2 2   Restless 0 2 2 2   Easily annoyed or irritable 0 3 1 2   Afraid - awful might happen 0 3 2 1   Total GAD 7 Score 2 19 13    Anxiety Difficulty Not difficult at all Very difficult Not difficult at all         ROS As per HPI.    Objective:     BP 98/67   Pulse 78   Temp 97.6 F (36.4 C) (Oral)   Wt 181 lb (82.1 kg)   LMP 01/14/2023 (Approximate)   SpO2 97%   BMI 32.06 kg/m    Physical Exam Vitals and nursing note reviewed.  Constitutional:      General: She is not in acute distress.    Appearance: Normal appearance. She is not ill-appearing, toxic-appearing or diaphoretic.  Cardiovascular:     Rate and Rhythm: Normal rate and  regular rhythm.     Heart sounds: Normal heart sounds. No murmur heard. Pulmonary:     Effort: Pulmonary effort is normal. No respiratory distress.     Breath sounds: Normal breath sounds.  Musculoskeletal:     Right lower leg: No edema.     Left lower leg: No edema.  Skin:    General: Skin is warm and dry.  Neurological:     General: No focal deficit present.     Mental Status: She is alert and oriented to person, place, and time.  Psychiatric:        Mood and Affect: Mood normal.        Behavior: Behavior normal.        Thought Content: Thought content normal.        Judgment: Judgment normal.      No results found for any visits on 02/04/23.    The ASCVD Risk score (Arnett DK, et al., 2019) failed  to calculate for the following reasons:   The 2019 ASCVD risk score is only valid for ages 40 to 33    Assessment & Plan:   Anne Underwood was seen today for follow-up.  Diagnoses and all orders for this visit:  Generalized anxiety disorder Depression, major, single episode, moderate (HCC) Well controlled on current regimen. Continue zoloft.    Return in about 3 months (around 05/07/2023) for CPE.  The patient indicates understanding of these issues and agrees with the plan.    Gabriel Earing, FNP

## 2023-02-04 NOTE — Patient Instructions (Signed)
  Dr. Scharlene Gloss Dermat[facility] Office [address], [place] [address] [phone]

## 2023-02-25 DIAGNOSIS — L821 Other seborrheic keratosis: Secondary | ICD-10-CM | POA: Diagnosis not present

## 2023-02-25 DIAGNOSIS — D164 Benign neoplasm of bones of skull and face: Secondary | ICD-10-CM | POA: Diagnosis not present

## 2023-03-04 ENCOUNTER — Ambulatory Visit (INDEPENDENT_AMBULATORY_CARE_PROVIDER_SITE_OTHER): Payer: BC Managed Care – PPO | Admitting: Otolaryngology

## 2023-03-04 ENCOUNTER — Encounter (INDEPENDENT_AMBULATORY_CARE_PROVIDER_SITE_OTHER): Payer: Self-pay | Admitting: Otolaryngology

## 2023-03-04 VITALS — BP 102/71 | HR 81 | Ht 63.0 in | Wt 181.0 lb

## 2023-03-04 DIAGNOSIS — D492 Neoplasm of unspecified behavior of bone, soft tissue, and skin: Secondary | ICD-10-CM | POA: Diagnosis not present

## 2023-03-04 DIAGNOSIS — G4452 New daily persistent headache (NDPH): Secondary | ICD-10-CM

## 2023-03-04 DIAGNOSIS — M898X8 Other specified disorders of bone, other site: Secondary | ICD-10-CM | POA: Diagnosis not present

## 2023-03-04 NOTE — Patient Instructions (Signed)
-   schedule CT head with contrast  - return after scan to review results

## 2023-03-04 NOTE — Progress Notes (Signed)
ENT CONSULT:  Reason for Consult: scalp/skull lesion     HPI: Anne Underwood is an 27 y.o. female healthy overall with history of several years of feeling a lump in the back of her right head/occiput area presents with new symptoms of head pain around the area and tension type headaches involving that area x last 2-3 months. Pain is dull and achy and mild, worse with direct pressure on the area of concern when she lies down. She can feel the lump with her fingers and it was also felt on exam when she saw her primary care doctor. No recent imaging however she did have CT head done in 2019 to rule out head injury due to concern for concussion, and it did showed bony prominence along the right occipital area based on my imaging review. 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 The patient denies any systemic symptoms such as fevers, chills, weight loss, neck lumps or posterior neck lumps, denies abnormal lab work recently, denies skin changes denies numbness tingling or weakness.  She reports she had right posterior head lump/deformity for years, but it wasn't until 2-3 months ago that she developed dull ache and occasional headache and it is sensitive to the pressure.  She has a toddler at home and delivered 2 years ago by C-section she has history of anxiety/depression and takes Zoloft.    Records Reviewed:  Office noted by PCP Harlow Mares from 02/04/23 and 12/24/22 Diagnosed with epidermoid cyst of the scalp by PCP - sent to see Derm      Past Medical History:  Diagnosis Date   ADD (attention deficit disorder)    Anxiety    Family history of adverse reaction to anesthesia    MOTHER HAD PONV    Ovarian cyst, follicular 10/14/2014   Pelvic pain in female 10/14/2014    Past Surgical History:  Procedure Laterality Date   CESAREAN SECTION N/A 07/01/2021   Procedure: CESAREAN SECTION;  Surgeon: Toy Baker, DO;  Location: [facility];  Service: Obstetrics;  Laterality: N/A;   COLONOSCOPY N/A 07/04/2018   Procedure: COLONOSCOPY;  Surgeon: West Bali, MD;  Location: [facility];  Service: Endoscopy;  Laterality: N/A;  2:00pm   LAPAROSCOPY N/A 10/15/2014   Procedure: LAPAROSCOPY OPERATIVE;  Surgeon: Sherian Rein, MD;  Location: [facility];  Service: Gynecology;  Laterality: N/A;   OVARIAN CYST REMOVAL Right 10/15/2014   Procedure: OVARIAN CYSTECTOMY;  Surgeon: Sherian Rein, MD;  Location: [facility];  Service: Gynecology;  Laterality: Right;  Dr only needs 1hr OR time   WISDOM TOOTH EXTRACTION      Family History  Problem Relation Age of Onset   Healthy Mother    Cancer Mother 62       Breast Cancer   Healthy Father    COPD Maternal Grandfather    Heart attack Paternal Grandfather    Colon cancer Neg Hx    Colon polyps Neg Hx     Social History:  reports that she has never smoked. She has never used smokeless tobacco. She reports that she does not currently use alcohol. She reports that she does not use drugs.  Allergies: No  Known Allergies  Medications: I have reviewed the patient's current medications.   The PMH, PSH, Medications, Allergies, and SH were reviewed and updated.  ROS: Constitutional: Negative for fever, weight loss and weight gain. Cardiovascular: Negative for chest pain and dyspnea on exertion. Respiratory: Is not experiencing shortness of breath at rest. Gastrointestinal: Negative for nausea and vomiting. Neurological: Negative for headaches. Psychiatric: The patient is not nervous/anxiou  Blood pressure 102/71, pulse 81, height 5\' 3"  (1.6 m), weight 181 lb (82.1 kg), last menstrual period 01/14/2023, SpO2 98 %, unknown if currently  breastfeeding.  PHYSICAL EXAM:  Exam: General: Well-developed, well-nourished Respiratory Respiratory effort: Equal inspiration and expiration without stridor Cardiovascular Peripheral Vascular: Warm extremities with equal color/perfusion Eyes: No nystagmus with equal extraocular motion bilaterally Neuro/Psych/Balance: Patient oriented to person, place, and time; Appropriate mood and affect; Gait is intact with no imbalance; Cranial nerves I-XII are intact Head and Face Inspection: Normocephalic. Palpable bony prominence right occipital area, no overlying skin changes, no palpable posterior neck nodes. Normal hair growth over the area, no tenderness to palpation  Palpation: Facial skeleton intact without bony stepoffs Salivary Glands: No mass or tenderness Facial Strength: Facial motility symmetric and full bilaterally ENT Pinna: External ear intact and fully developed External canal: Canal is patent with intact skin Tympanic Membrane: Clear and mobile External Nose: No scar or anatomic deformity Internal Nose: Septum intact and midline. No edema, polyp, or rhinorrhea Lips, Teeth, and gums: Mucosa and teeth intact and viable TMJ: No pain to palpation with full mobility Oral cavity/oropharynx: No erythema or exudate, no lesions present Neck Neck and Trachea: Midline trachea without mass or lesion Thyroid: No mass or nodularity Lymphatics: No lymphadenopathy  Procedure: none       Studies Reviewed: CT head without contrast 02/25/2018 IContiguous axial images were obtained from the base of the skull through the vertex without intravenous contrast.   COMPARISON:  None.   FINDINGS: Brain: There is no evidence of acute infarct, intracranial hemorrhage, midline shift, or extra-axial fluid collection. The ventricles and sulci are normal in size and configuration. 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   Vascular: No hyperdense vessel.   Skull: No fracture or focal osseous lesion.   Sinuses/Orbits: Visualized paranasal sinuses and mastoid air cells are clear. Visualized orbits are unremarkable.   Other: None.   IMPRESSION: 1. No evidence of acute intracranial abnormality. 2. Incidental small pericallosal lipoma.  Assessment/Plan: Encounter Diagnoses  Name Primary?   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  She denies skin changes or any neck lumps or posterior neck lumps that she can appreciate herself and denies systemic symptoms like fevers chills or weight loss.  I personally reviewed head CT done in July 2019 and there appears to be a bony prominence along the right occipital/parietal area.  This area is consistent with the palpable bony prominence on exam.   On exam - palpable bony prominence right occipital area, no overlying skin changes, no palpable posterior neck nodes. Normal hair growth over the  area, no tenderness to palpation  She did not have any recent scans of the area and we will order CT head with contrast to better assess area of concern.  I do not believe this is cystic in nature based on exam and imaging findings thus far.  This likely represents bony lesion benign versus malignant neoplasm is on the differential diagnosis.  She may require neurosurgery consultation which we will consider in the future since if the lesion does involve bony calvarium, biopsy might be challenging and or may need to be performed by neurosurgery.  Will consider CT neck with contrast vs MRI  neck with contrast if indicated based on results of CT head  - CT head with contrast  - RTC after imaging   Thank you for allowing me to participate in the care of this patient. Please do not hesitate to contact me with any questions or concerns.   Ashok Croon, MD Otolaryngology [facility] Phone: [phone] Fax: [phone]    03/04/2023, 5:33 PM

## 2023-03-14 ENCOUNTER — Telehealth: Payer: Self-pay | Admitting: Otolaryngology

## 2023-03-14 NOTE — Telephone Encounter (Signed)
[facility] called stated pt CT needs to be authorized, pt apt is 03-18-23 at [facility]

## 2023-03-18 ENCOUNTER — Ambulatory Visit (HOSPITAL_COMMUNITY): Payer: BC Managed Care – PPO

## 2023-03-18 DIAGNOSIS — Z3200 Encounter for pregnancy test, result unknown: Secondary | ICD-10-CM | POA: Diagnosis not present

## 2023-03-19 ENCOUNTER — Telehealth: Payer: Self-pay | Admitting: Otolaryngology

## 2023-03-19 NOTE — Telephone Encounter (Signed)
Pt called. Was supposed to have CT with contrast on yesterday, but found out Friday she is pregnant. OB/GYN said no CT so pt is wanting to know what other test could be done instead. Please call her at [phone]

## 2023-03-20 NOTE — Telephone Encounter (Signed)
Yes I did

## 2023-03-20 NOTE — Telephone Encounter (Signed)
I spoke with the patient today. If she cannot have a CT the next best imaging modality would be MRI. She will need to discuss this with her OBGYN and if she feels this is safe to proceed we can place the order. She will let us know.

## 2023-03-25 ENCOUNTER — Encounter (HOSPITAL_COMMUNITY): Payer: Self-pay | Admitting: Obstetrics and Gynecology

## 2023-03-25 ENCOUNTER — Inpatient Hospital Stay (HOSPITAL_COMMUNITY)
Admission: AD | Admit: 2023-03-25 | Discharge: 2023-03-25 | Disposition: A | Discharge status: Home / Self Care | Payer: BC Managed Care – PPO | Attending: Obstetrics and Gynecology | Admitting: Obstetrics and Gynecology

## 2023-03-25 ENCOUNTER — Inpatient Hospital Stay (HOSPITAL_COMMUNITY): Payer: BC Managed Care – PPO

## 2023-03-25 ENCOUNTER — Ambulatory Visit (INDEPENDENT_AMBULATORY_CARE_PROVIDER_SITE_OTHER): Payer: BC Managed Care – PPO | Admitting: Otolaryngology

## 2023-03-25 DIAGNOSIS — N939 Abnormal uterine and vaginal bleeding, unspecified: Secondary | ICD-10-CM

## 2023-03-25 DIAGNOSIS — Z3A01 Less than 8 weeks gestation of pregnancy: Secondary | ICD-10-CM | POA: Insufficient documentation

## 2023-03-25 DIAGNOSIS — O3480 Maternal care for other abnormalities of pelvic organs, unspecified trimester: Secondary | ICD-10-CM | POA: Diagnosis not present

## 2023-03-25 DIAGNOSIS — O039 Complete or unspecified spontaneous abortion without complication: Secondary | ICD-10-CM | POA: Insufficient documentation

## 2023-03-25 DIAGNOSIS — O3680X0 Pregnancy with inconclusive fetal viability, not applicable or unspecified: Secondary | ICD-10-CM | POA: Diagnosis not present

## 2023-03-25 DIAGNOSIS — N858 Other specified noninflammatory disorders of uterus: Secondary | ICD-10-CM | POA: Diagnosis not present

## 2023-03-25 DIAGNOSIS — O209 Hemorrhage in early pregnancy, unspecified: Secondary | ICD-10-CM | POA: Diagnosis not present

## 2023-03-25 DIAGNOSIS — Z3A00 Weeks of gestation of pregnancy not specified: Secondary | ICD-10-CM | POA: Diagnosis not present

## 2023-03-25 LAB — URINALYSIS, ROUTINE W REFLEX MICROSCOPIC
Bacteria, UA: NONE SEEN
Bilirubin Urine: NEGATIVE
Glucose, UA: NEGATIVE mg/dL
Ketones, ur: NEGATIVE mg/dL
Leukocytes,Ua: NEGATIVE
Nitrite: NEGATIVE
Protein, ur: NEGATIVE mg/dL
Specific Gravity, Urine: 1.012 (ref 1.005–1.030)
pH: 6 (ref 5.0–8.0)

## 2023-03-25 LAB — CBC
HCT: 41.2 % (ref 36.0–46.0)
Hemoglobin: 13.6 g/dL (ref 12.0–15.0)
MCH: 29.7 pg (ref 26.0–34.0)
MCHC: 33 g/dL (ref 30.0–36.0)
MCV: 90 fL (ref 80.0–100.0)
Platelets: 264 10*3/uL (ref 150–400)
RBC: 4.58 MIL/uL (ref 3.87–5.11)
RDW: 13.9 % (ref 11.5–15.5)
WBC: 5.8 10*3/uL (ref 4.0–10.5)
nRBC: 0 % (ref 0.0–0.2)

## 2023-03-25 LAB — WET PREP, GENITAL
Clue Cells Wet Prep HPF POC: NONE SEEN
Sperm: NONE SEEN
Trich, Wet Prep: NONE SEEN
WBC, Wet Prep HPF POC: <10 (ref <10)
Yeast Wet Prep HPF POC: NONE SEEN

## 2023-03-25 LAB — ABO/RH: ABO/RH(D): A POS

## 2023-03-25 LAB — HCG, QUANTITATIVE, PREGNANCY: hCG, Beta Chain, Quant, S: 2047 m[IU]/mL — ABNORMAL HIGH (ref <5)

## 2023-03-25 NOTE — MAU Provider Note (Signed)
History     CSN: [id number]  Arrival date and time: 03/25/23 1016   None     Chief Complaint  Patient presents with   Vaginal Bleeding   Anne Underwood , a  27 y.o. G2P1001 at [redacted]w[redacted]d presents to MAU with complaints of vaginal bleeding and abdominal cramping that started yesterday. Patient states that she having bright red, moderate bleeding and passing small dime sized clots. She denies wearing a pad but states "when she wipes its a lot." She describes pain as cramping and intermittent in her lower abdomen. Currently rating pain a 5/10 and denies attempting to relieve symptoms. She denies urinary symptoms and abnormal vaginal discharge.          OB History     Gravida  2   Para  1   Term  1   Preterm      AB      Living  1      SAB      IAB      Ectopic      Multiple  0   Live Births  1           Past Medical History:  Diagnosis Date   ADD (attention deficit disorder)    Anxiety    Family history of adverse reaction to anesthesia    MOTHER HAD PONV   Ovarian cyst, follicular 10/14/2014   Pelvic pain in female 10/14/2014    Past Surgical History:  Procedure Laterality Date   CESAREAN SECTION N/A 07/01/2021   Procedure: CESAREAN SECTION;  Surgeon: Clance Boll A, DO;  Location: [facility];  Service: Obstetrics;  Laterality: N/A;   COLONOSCOPY N/A 07/04/2018   Procedure: COLONOSCOPY;  Surgeon: West Bali, MD;  [facility];  Service: Endoscopy;  Laterality: N/A;  2:00pm   LAPAROSCOPY N/A 10/15/2014   Procedure: LAPAROSCOPY OPERATIVE;  Surgeon: Sherian Rein, MD;  Location: [facility];  Service: Gynecology;  Laterality: N/A;   OVARIAN CYST REMOVAL Right 10/15/2014   Procedure: OVARIAN CYSTECTOMY;  Surgeon: Sherian Rein, MD;  Location: [facility];  Service: Gynecology;  Laterality: Right;  Dr only needs 1hr OR time   WISDOM TOOTH EXTRACTION      Family History  Problem Relation Age of Onset   Healthy Mother    Cancer Mother 52        Breast Cancer   Healthy Father    COPD Maternal Grandfather    Heart attack Paternal Grandfather    Colon cancer Neg Hx    Colon polyps Neg Hx     Social History   Tobacco Use   Smoking status: Never   Smokeless tobacco: Never  Vaping Use   Vaping status: Never Used  Substance Use Topics   Alcohol use: Not Currently   Drug use: No    Allergies: No Known Allergies  Medications Prior to Admission  Medication Sig Dispense Refill Last Dose   Prenatal Vit-Fe Fumarate-FA (PRENATAL MULTIVITAMIN) TABS tablet Take 1 tablet by mouth daily at 12 noon.   03/24/2023   sertraline (ZOLOFT) 100 MG tablet Take 1 tablet (100 mg total) by mouth daily. 90 tablet 3 03/24/2023    Review of Systems  Constitutional:  Negative for chills, fatigue and fever.  Eyes:  Negative for pain and visual disturbance.  Respiratory:  Negative for apnea, shortness of breath and wheezing.   Cardiovascular:  Negative for chest pain and palpitations.  Gastrointestinal:  Positive for abdominal pain. Negative for constipation,  diarrhea, nausea and vomiting.  Genitourinary:  Positive for vaginal bleeding. Negative for difficulty urinating, dysuria, pelvic pain, vaginal discharge and vaginal pain.  Musculoskeletal:  Negative for back pain.  Neurological:  Negative for seizures, weakness and headaches.  Psychiatric/Behavioral:  Negative for suicidal ideas.    Physical Exam   Blood pressure 116/72, pulse 82, temperature 98.1 F (36.7 C), temperature source Oral, resp. rate 14, last menstrual period 01/14/2023, unknown if currently breastfeeding.  Physical Exam Vitals and nursing note reviewed. Exam conducted with a chaperone present.  Constitutional:      General: She is in acute distress.     Appearance: Normal appearance.     Comments: Patient very tearful in triage   HENT:     Head: Normocephalic.  Cardiovascular:     Rate and Rhythm: Normal rate.  Pulmonary:     Effort: Pulmonary effort is normal.   Abdominal:     Palpations: Abdomen is soft.  Genitourinary:    Comments: Dark red vaginal bleeding noted in vaginal vault. 2 dime sized clots removed with 2 fox swabs. 1 quarter-sized clot of tissue, consistent with Products of conception removed with ring forceps.  Musculoskeletal:        General: Normal range of motion.     Cervical back: Normal range of motion.  Skin:    General: Skin is warm and dry.  Neurological:     Mental Status: She is alert and oriented to person, place, and time.  Psychiatric:        Mood and Affect: Mood normal.     MAU Course  Procedures Orders Placed This Encounter  Procedures   Wet prep, genital   US OB LESS THAN 14 WEEKS WITH OB TRANSVAGINAL   CBC   hCG, quantitative, pregnancy   Diet NPO time specified   ABO/Rh   Results for orders placed or performed during the hospital encounter of 03/25/23 (from the past 24 hour(s))  ABO/Rh     Status: None   Collection Time: 03/25/23 10:54 AM  Result Value Ref Range   ABO/RH(D) A POS    No rh immune globuloin      NOT A RH IMMUNE GLOBULIN CANDIDATE, PT RH POSITIVE Performed at [facility], [address] [place], [place] [address]   CBC     Status: None   Collection Time: 03/25/23 10:55 AM  Result Value Ref Range   WBC 5.8 4.0 - 10.5 K/uL   RBC 4.58 3.87 - 5.11 MIL/uL   Hemoglobin 13.6 12.0 - 15.0 g/dL   HCT 81.1 91.4 - 78.2 %   MCV 90.0 80.0 - 100.0 fL   MCH 29.7 26.0 - 34.0 pg   MCHC 33.0 30.0 - 36.0 g/dL   RDW 95.6 21.3 - 08.6 %   Platelets 264 150 - 400 K/uL   nRBC 0.0 0.0 - 0.2 %  hCG, quantitative, pregnancy     Status: Abnormal   Collection Time: 03/25/23 10:55 AM  Result Value Ref Range   hCG, Beta Chain, Quant, S 2,047 (H) <5 mIU/mL  Wet prep, genital     Status: None   Collection Time: 03/25/23 10:55 AM   Specimen: Vaginal  Result Value Ref Range   Yeast Wet Prep HPF POC NONE SEEN NONE SEEN   Trich, Wet Prep NONE SEEN NONE SEEN   Clue Cells Wet Prep HPF POC NONE SEEN  NONE SEEN   WBC, Wet Prep HPF POC <10 <10   Sperm NONE SEEN  Urinalysis, Routine w reflex microscopic -Urine, Clean Catch     Status: Abnormal   Collection Time: 03/25/23 11:08 AM  Result Value Ref Range   Color, Urine YELLOW YELLOW   APPearance CLEAR CLEAR   Specific Gravity, Urine 1.012 1.005 - 1.030   pH 6.0 5.0 - 8.0   Glucose, UA NEGATIVE NEGATIVE mg/dL   Hgb urine dipstick MODERATE (A) NEGATIVE   Bilirubin Urine NEGATIVE NEGATIVE   Ketones, ur NEGATIVE NEGATIVE mg/dL   Protein, ur NEGATIVE NEGATIVE mg/dL   Nitrite NEGATIVE NEGATIVE   Leukocytes,Ua NEGATIVE NEGATIVE   RBC / HPF 21-50 0 - 5 RBC/hpf   WBC, UA 0-5 0 - 5 WBC/hpf   Bacteria, UA NONE SEEN NONE SEEN   Squamous Epithelial / HPF 0-5 0 - 5 /HPF   Mucus PRESENT    US OB LESS THAN 14 WEEKS WITH OB TRANSVAGINAL  Result Date: 03/25/2023 CLINICAL DATA:  Vaginal bleeding. EXAM: OBSTETRIC <14 WK [place] AND TRANSVAGINAL OB US TECHNIQUE: Both transabdominal and transvaginal ultrasound examinations were performed for complete evaluation of the gestation as well as the maternal uterus, adnexal regions, and pelvic cul-de-sac. Transvaginal technique was performed to assess early pregnancy. COMPARISON:  None Available. FINDINGS: Intrauterine gestational sac: None Yolk sac:  Not Visualized. Embryo:  Not Visualized. Cardiac Activity: Not Visualized. Subchorionic hemorrhage:  N/a Maternal uterus/adnexae: The left ovary is normal measuring 2.7 cm x 2.1 cm x 2.5 cm. The right ovary is normal measuring 2.6 cm x 2.1 cm x 1.4 cm. There is no adnexal mass. There is a small amount of fluid in the endometrial cavity. There is trace free fluid in the pelvis. IMPRESSION: No intrauterine pregnancy or adnexal mass identified. Given the provided history, findings are favored to reflect completed miscarriage, with early pregnancy or occult ectopic less likely. Recommend close obstetric follow-up and serial beta HCG. Electronically Signed   By: Lesia Hausen M.D.    On: 03/25/2023 12:29     MDM - Tissue suspicious of products of conception removed and sent to path.  - Hgb and Platelet count normal, patient hemodynamically stable.  -  Wet prep normal  - UA positive for blood otherwise normal  - Low suspicion for infection  - Quant > 2,000  - [place] results revealed no IUP, given specimen collection and the amount of bleeding, highly suspicious of miscarriage in process.  - Call placed to A. Jones, CNM on call provider to recommend follow up in office in 2-4 weeks. CNM agrees with plan of care.  - plan for discharge.   Assessment and Plan   1. Miscarriage   2. Vaginal bleeding   3. [redacted] weeks gestation of pregnancy    - Reviewed results of miscarriage with patient and FOB. Patient appropriately tearful. Condolences given.  - Worsening signs and return precautions reviewed. - Patient discharged home in stable condition and may return to MAU as needed.   Claudette Head, MSN CNM  03/25/2023, 10:43 AM

## 2023-03-25 NOTE — MAU Note (Signed)
.  Anne Underwood is a 27 y.o. at  here in MAU reporting: vaginal bleeding and lower abdominal cramping. States is is a moderate amount and fluctuated between bright red and dark red. Passed a few small clots yesterday. Has not had anything for pain. Had an appointment at [facility] on 7/19 for confirmation, has not had an ultrasound yet.   Pain score: 5 Vitals:   03/25/23 1033  BP: 116/72  Pulse: 82  Resp: 14  Temp: 98.1 F (36.7 C)      Lab orders placed from triage:  UA

## 2023-04-08 DIAGNOSIS — O021 Missed abortion: Secondary | ICD-10-CM | POA: Diagnosis not present

## 2023-05-02 ENCOUNTER — Other Ambulatory Visit (INDEPENDENT_AMBULATORY_CARE_PROVIDER_SITE_OTHER): Payer: Self-pay | Admitting: Otolaryngology

## 2023-05-02 ENCOUNTER — Ambulatory Visit (HOSPITAL_COMMUNITY)
Admission: RE | Admit: 2023-05-02 | Discharge: 2023-05-02 | Disposition: A | Discharge status: Home / Self Care | Payer: BC Managed Care – PPO | Source: Ambulatory Visit | Attending: Otolaryngology | Admitting: Otolaryngology

## 2023-05-02 ENCOUNTER — Ambulatory Visit (HOSPITAL_COMMUNITY): Payer: BC Managed Care – PPO

## 2023-05-02 DIAGNOSIS — D492 Neoplasm of unspecified behavior of bone, soft tissue, and skin: Secondary | ICD-10-CM

## 2023-05-02 DIAGNOSIS — M898X8 Other specified disorders of bone, other site: Secondary | ICD-10-CM

## 2023-05-02 DIAGNOSIS — R930 Abnormal findings on diagnostic imaging of skull and head, not elsewhere classified: Secondary | ICD-10-CM | POA: Diagnosis not present

## 2023-05-02 DIAGNOSIS — G4452 New daily persistent headache (NDPH): Secondary | ICD-10-CM

## 2023-05-02 MED ORDER — IOHEXOL 350 MG/ML SOLN
75.0000 mL | Freq: Once | INTRAVENOUS | Status: AC | PRN
  Administered 2023-05-02: 75 mL via INTRAVENOUS

## 2023-05-10 NOTE — Progress Notes (Unsigned)
I attempted to call the patient about her upcoming appointment on Monday. I will try again later.

## 2023-05-13 ENCOUNTER — Ambulatory Visit (INDEPENDENT_AMBULATORY_CARE_PROVIDER_SITE_OTHER): Payer: BC Managed Care – PPO | Admitting: Otolaryngology

## 2023-05-13 ENCOUNTER — Encounter (INDEPENDENT_AMBULATORY_CARE_PROVIDER_SITE_OTHER): Payer: Self-pay | Admitting: Otolaryngology

## 2023-05-13 VITALS — BP 107/72 | HR 80 | Ht 63.0 in | Wt 185.6 lb

## 2023-05-13 DIAGNOSIS — D492 Neoplasm of unspecified behavior of bone, soft tissue, and skin: Secondary | ICD-10-CM

## 2023-05-13 DIAGNOSIS — G4452 New daily persistent headache (NDPH): Secondary | ICD-10-CM

## 2023-05-13 DIAGNOSIS — M898X8 Other specified disorders of bone, other site: Secondary | ICD-10-CM

## 2023-05-13 NOTE — Progress Notes (Signed)
ENT Progress Note  Update 05/13/23: She returns today for follow-up after CT head, denies changes in her symptoms, here to discuss results.     Initial HPI from 03/25/23:  Reason for Consult: scalp/skull lesion     HPI: Anne Underwood is an 27 y.o. female healthy overall with history of several years of feeling a lump in the back of her right head/occiput area presents with new symptoms of head pain around the area and tension type headaches involving that area x last 2-3 months. Pain is dull and achy and mild, worse with direct pressure on the area of concern when she lies down. She can feel the lump with her fingers and it was also felt on exam when she saw her primary care doctor. No recent imaging however she did have CT head done in 2019 to rule out head injury due to concern for concussion, and it did showed bony prominence along the right occipital area based on my imaging review. 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 The patient denies any systemic symptoms such as fevers, chills, weight loss, neck lumps or posterior neck lumps, denies abnormal lab work recently, denies skin changes denies numbness tingling or weakness.  She reports she had right posterior head lump/deformity for years, but it wasn't until 2-3 months ago that she developed dull ache and occasional headache and it is sensitive to the pressure.  She has a toddler at home and delivered 2 years ago by C-section she has history of anxiety/depression and takes Zoloft.    Records Reviewed:  Office noted by PCP Harlow Mares from 02/04/23 and 12/24/22 Diagnosed with epidermoid cyst of the scalp by PCP - sent to see Derm       Past Medical History:  Diagnosis Date   ADD (attention deficit disorder)    Anxiety    Family history of adverse reaction to anesthesia    MOTHER HAD PONV   Ovarian cyst, follicular 10/14/2014   Pelvic pain in female 10/14/2014    Past Surgical History:  Procedure Laterality Date   CESAREAN SECTION N/A 07/01/2021   Procedure: CESAREAN SECTION;  Surgeon: Toy Baker, DO;  Location: [facility];  Service: Obstetrics;  Laterality: N/A;   COLONOSCOPY N/A 07/04/2018   Procedure: COLONOSCOPY;  Surgeon: West Bali, MD;  Location: [facility];  Service: Endoscopy;  Laterality: N/A;  2:00pm   LAPAROSCOPY N/A 10/15/2014   Procedure: LAPAROSCOPY OPERATIVE;  Surgeon: Sherian Rein, MD;  Location: [facility];  Service: Gynecology;  Laterality: N/A;   OVARIAN CYST REMOVAL Right 10/15/2014   Procedure: OVARIAN CYSTECTOMY;  Surgeon: Sherian Rein, MD;  Location: [facility];  Service: Gynecology;  Laterality: Right;  Dr only needs 1hr OR time   WISDOM TOOTH EXTRACTION      Family History  Problem Relation Age of Onset   Healthy Mother    Cancer Mother 59       Breast Cancer   Healthy Father    COPD Maternal Grandfather    Heart attack Paternal Grandfather    Colon cancer Neg Hx    Colon polyps Neg Hx     Social History:  reports that she  has never smoked. She has never used smokeless tobacco. She reports that she does not currently use alcohol. She reports that she does not use drugs.  Allergies: No Known Allergies  Medications: I have reviewed the patient's current medications.   The PMH, PSH, Medications, Allergies, and SH were reviewed and updated.  ROS: Constitutional: Negative for fever, weight loss and weight gain. Cardiovascular: Negative for chest pain and dyspnea on exertion. Respiratory: Is not experiencing shortness of breath at rest. Gastrointestinal: Negative for nausea and vomiting. Neurological: Negative for headaches. Psychiatric: The patient is not  nervous/anxiou  Blood pressure 107/72, pulse 80, height 5\' 3"  (1.6 m), weight 185 lb 9.6 oz (84.2 kg), last menstrual period 04/27/2023, SpO2 98%, unknown if currently breastfeeding.  PHYSICAL EXAM:  Exam: General: Well-developed, well-nourished Respiratory Respiratory effort: Equal inspiration and expiration without stridor Cardiovascular Peripheral Vascular: Warm extremities with equal color/perfusion Neuro/Psych/Balance: Patient oriented to person, place, and time; Appropriate mood and affect; Gait is intact with no imbalance; Cranial nerves I-XII are intact Head and Face Inspection: Normocephalic. Previously  seen palpable bony prominence right occipital area, no overlying skin changes, no palpable posterior neck nodes. Normal hair growth over the area, no tenderness to palpation  Palpation: Facial skeleton intact without bony stepoffs   Procedure: none   Studies Reviewed:  CT head 05/02/23  -radiology report still pending, however I reviewed the images and there appears to be bony prominence along the right occipital area, same location where old head CT done in 2019 showed lesion of concern which was thought to be lipoma.  On the recent CT head it appears to be slightly bigger but still only involves the bony structures and does not appear to involve the overlying soft tissues or extends into the cranium   CT head without contrast 02/25/2018 IContiguous axial images were obtained from the base of the skull through the vertex without intravenous contrast.   COMPARISON:  None.   FINDINGS: Brain: There is no evidence of acute infarct, intracranial hemorrhage, midline shift, or extra-axial fluid collection. The ventricles and sulci are normal in size and configuration. A small fat density midline lesion along the undersurface of the posterior corpus callosum extends over approximately 2 cm maximal length and is most consistent with a benign and incidental lipoma without  mass effect.   Vascular: No hyperdense vessel.   Skull: No fracture or focal osseous lesion.   Sinuses/Orbits: Visualized paranasal sinuses and mastoid air cells are clear. Visualized orbits are unremarkable.   Other: None.   IMPRESSION: 1. No evidence of acute intracranial abnormality. 2. Incidental small pericallosal lipoma.  Assessment/Plan: Encounter Diagnoses  Name Primary?   Mass of skull Yes   Mass of parietal bone of skull    New daily persistent headache    Neoplasm of bone of skull     27 year old healthy female who presents for initial evaluation of bony prominence along right occipital region seen on old CT head done in 2019 which she reports appears to have enlarged over time and now causing dull achy pain.  She denies skin changes or any neck lumps or posterior neck lumps that she can appreciate herself and denies systemic symptoms like fevers chills or weight loss.  I personally reviewed head CT done in July 2019 and there appears to be a bony prominence along the right occipital/parietal area.  This area is consistent with the palpable bony prominence on exam.   On exam - palpable bony prominence right occipital area, no overlying  skin changes, no palpable posterior neck nodes. Normal hair growth over the area, no tenderness to palpation  She did not have any recent scans of the area and we will order CT head with contrast to better assess area of concern.  I do not believe this is cystic in nature based on exam and imaging findings thus far.  This likely represents bony lesion benign versus malignant neoplasm is on the differential diagnosis.  She may require neurosurgery consultation which we will consider in the future since if the lesion does involve bony calvarium, biopsy might be challenging and or may need to be performed by neurosurgery.  Will consider CT neck with contrast vs MRI neck with contrast if indicated based on results of CT head  - CT head with  contrast  - RTC after imaging   Update 05/13/23: She returns after imaging, and denies any changes in her symptoms since last office visit.  Previously palpable right occipital bony prominence on exam today essentially unchanged.   CT head 05/02/23  -radiology report still pending, however I reviewed the images and there appears to be bony prominence along the right occipital area, same location where old head CT done in 2019 showed lesion of concern which was thought to be lipoma.  On the recent CT head it appears to be slightly bigger but still only involves the bony structures and does not appear to involve the overlying soft tissues or extends into the cranium  I discussed imaging findings with the patient, and in comparison to her old CT done in 2019 the lesion appears to be larger but not significantly larger.  I advised her to discuss the findings and have imaging review with neurosurgery.  Will send a referral.  I reassured the patient that based on my imaging review and exam the lesion is likely benign.  All questions have been answered.   - referral to NSG  Ashok Croon, MD Otolaryngology [facility] Phone: [phone] Fax: [phone]    05/13/2023, 5:51 PM

## 2023-05-31 ENCOUNTER — Encounter: Payer: BC Managed Care – PPO | Admitting: Family Medicine

## 2023-06-12 DIAGNOSIS — Z8759 Personal history of other complications of pregnancy, childbirth and the puerperium: Secondary | ICD-10-CM | POA: Diagnosis not present

## 2023-06-12 DIAGNOSIS — Z3200 Encounter for pregnancy test, result unknown: Secondary | ICD-10-CM | POA: Diagnosis not present

## 2023-06-17 DIAGNOSIS — Z8759 Personal history of other complications of pregnancy, childbirth and the puerperium: Secondary | ICD-10-CM | POA: Diagnosis not present

## 2023-06-17 DIAGNOSIS — Z3200 Encounter for pregnancy test, result unknown: Secondary | ICD-10-CM | POA: Diagnosis not present

## 2023-06-19 DIAGNOSIS — Z3A00 Weeks of gestation of pregnancy not specified: Secondary | ICD-10-CM | POA: Diagnosis not present

## 2023-06-19 DIAGNOSIS — O209 Hemorrhage in early pregnancy, unspecified: Secondary | ICD-10-CM | POA: Diagnosis not present

## 2023-06-26 DIAGNOSIS — O209 Hemorrhage in early pregnancy, unspecified: Secondary | ICD-10-CM | POA: Diagnosis not present

## 2023-06-26 DIAGNOSIS — O0281 Inappropriate change in quantitative human chorionic gonadotropin (hCG) in early pregnancy: Secondary | ICD-10-CM | POA: Diagnosis not present

## 2023-06-26 DIAGNOSIS — N96 Recurrent pregnancy loss: Secondary | ICD-10-CM | POA: Diagnosis not present

## 2023-07-22 DIAGNOSIS — N96 Recurrent pregnancy loss: Secondary | ICD-10-CM | POA: Diagnosis not present

## 2023-07-31 ENCOUNTER — Ambulatory Visit: Payer: BC Managed Care – PPO

## 2023-08-12 DIAGNOSIS — Z3201 Encounter for pregnancy test, result positive: Secondary | ICD-10-CM | POA: Diagnosis not present

## 2023-08-12 DIAGNOSIS — Z3200 Encounter for pregnancy test, result unknown: Secondary | ICD-10-CM | POA: Diagnosis not present

## 2023-08-14 DIAGNOSIS — N96 Recurrent pregnancy loss: Secondary | ICD-10-CM | POA: Diagnosis not present

## 2023-08-16 DIAGNOSIS — N96 Recurrent pregnancy loss: Secondary | ICD-10-CM | POA: Diagnosis not present

## 2023-09-06 DIAGNOSIS — Z3201 Encounter for pregnancy test, result positive: Secondary | ICD-10-CM | POA: Diagnosis not present

## 2023-09-25 DIAGNOSIS — Z3A10 10 weeks gestation of pregnancy: Secondary | ICD-10-CM | POA: Diagnosis not present

## 2023-09-25 DIAGNOSIS — Z118 Encounter for screening for other infectious and parasitic diseases: Secondary | ICD-10-CM | POA: Diagnosis not present

## 2023-09-25 DIAGNOSIS — Z124 Encounter for screening for malignant neoplasm of cervix: Secondary | ICD-10-CM | POA: Diagnosis not present

## 2023-09-25 DIAGNOSIS — Z3689 Encounter for other specified antenatal screening: Secondary | ICD-10-CM | POA: Diagnosis not present

## 2023-09-25 DIAGNOSIS — O2621 Pregnancy care for patient with recurrent pregnancy loss, first trimester: Secondary | ICD-10-CM | POA: Diagnosis not present

## 2023-09-25 LAB — OB RESULTS CONSOLE HIV ANTIBODY (ROUTINE TESTING): HIV: NONREACTIVE

## 2023-09-25 LAB — OB RESULTS CONSOLE RPR: RPR: NONREACTIVE

## 2023-09-25 LAB — OB RESULTS CONSOLE HEPATITIS B SURFACE ANTIGEN: Hepatitis B Surface Ag: NEGATIVE

## 2023-09-25 LAB — OB RESULTS CONSOLE RUBELLA ANTIBODY, IGM: Rubella: IMMUNE

## 2023-09-25 LAB — OB RESULTS CONSOLE GC/CHLAMYDIA
Chlamydia: NEGATIVE
Neisseria Gonorrhea: NEGATIVE

## 2023-09-25 LAB — HEPATITIS C ANTIBODY: HCV Ab: NEGATIVE

## 2023-10-04 DIAGNOSIS — Z3481 Encounter for supervision of other normal pregnancy, first trimester: Secondary | ICD-10-CM | POA: Diagnosis not present

## 2023-10-04 DIAGNOSIS — Z3689 Encounter for other specified antenatal screening: Secondary | ICD-10-CM | POA: Diagnosis not present

## 2023-10-15 DIAGNOSIS — O2341 Unspecified infection of urinary tract in pregnancy, first trimester: Secondary | ICD-10-CM | POA: Diagnosis not present

## 2023-10-15 DIAGNOSIS — Z3A13 13 weeks gestation of pregnancy: Secondary | ICD-10-CM | POA: Diagnosis not present

## 2023-10-28 DIAGNOSIS — R300 Dysuria: Secondary | ICD-10-CM | POA: Diagnosis not present

## 2023-10-28 DIAGNOSIS — N898 Other specified noninflammatory disorders of vagina: Secondary | ICD-10-CM | POA: Diagnosis not present

## 2023-11-09 ENCOUNTER — Ambulatory Visit
Admission: RE | Admit: 2023-11-09 | Discharge: 2023-11-09 | Disposition: A | Discharge status: Home / Self Care | Source: Ambulatory Visit | Attending: Nurse Practitioner

## 2023-11-09 VITALS — BP 118/78 | HR 109 | Temp 98.9°F | Resp 18

## 2023-11-09 DIAGNOSIS — J029 Acute pharyngitis, unspecified: Secondary | ICD-10-CM | POA: Diagnosis not present

## 2023-11-09 DIAGNOSIS — K121 Other forms of stomatitis: Secondary | ICD-10-CM | POA: Insufficient documentation

## 2023-11-09 LAB — POCT RAPID STREP A (OFFICE): Rapid Strep A Screen: NEGATIVE

## 2023-11-09 MED ORDER — NYSTATIN 100000 UNIT/ML MT SUSP: 15.0000 mL | Freq: Four times a day (QID) | OROMUCOSAL | 0 refills | Status: DC | PRN

## 2023-11-09 NOTE — ED Triage Notes (Signed)
 Pt report she has had a sore throat x 3 days

## 2023-11-09 NOTE — Discharge Instructions (Addendum)
 The rapid strep test was negative.  A throat culture is pending.  You will be contacted if the pending test result is positive.  You also have access to the results via MyChart. Take medication as prescribed. May take over-the-counter Tylenol as needed for pain, fever, or general discomfort. Recommend a bland or soft diet.  This includes soup, broth, yogurt, pudding, Jell-O, or popsicles while symptoms persist. Continue warm salt water gargles 3-4 times daily as needed. As discussed, if symptoms fail to improve, or appear to be worsening, it is recommended that you follow-up with your primary care physician for further evaluation. Follow-up as needed.

## 2023-11-09 NOTE — ED Provider Notes (Signed)
 [facility]    CSN: [id number] Arrival date & time: 11/09/23  4782      History   Chief Complaint Chief Complaint  Patient presents with   Sore Throat    Entered by patient    HPI Anne Underwood is a 28 y.o. female.   The history is provided by the patient.   Patient presents for complaints of sore throat, and mouth pain that started over the past 3 days.  Patient states that she initially had something stuck in her tooth and lost about.  She states since that time, she has noticed increased pain and swelling to her gums, she also notes an ulcer in her mouth.  Denies fever, chills, nasal congestion, runny nose, cough, abdominal pain, nausea, vomiting, diarrhea, or rash.  Patient states she has been using warm salt water gargles.  Patient is approximately [redacted] weeks pregnant. Past Medical History:  Diagnosis Date   ADD (attention deficit disorder)    Anxiety    Family history of adverse reaction to anesthesia    MOTHER HAD PONV   Ovarian cyst, follicular 10/14/2014   Pelvic pain in female 10/14/2014    Patient Active Problem List   Diagnosis Date Noted   Status post primary low transverse cesarean section - NRFHTs 07/01/2021   Postpartum care following cesarean delivery 11/5 07/01/2021   Iron deficiency anemia 07/01/2021   Encounter for induction of labor 06/30/2021   Maternal anemia in pregnancy, antepartum - IDA 06/30/2021   PAC (premature atrial contraction) 05/10/2021   Generalized anxiety disorder 04/14/2020    Past Surgical History:  Procedure Laterality Date   CESAREAN SECTION N/A 07/01/2021   Procedure: CESAREAN SECTION;  Surgeon: Clance Boll A, DO;  Location: [facility];  Service: Obstetrics;  Laterality: N/A;   COLONOSCOPY N/A 07/04/2018   Procedure: COLONOSCOPY;  Surgeon: West Bali, MD;  [facility]: [facility];  Service: Endoscopy;  Laterality: N/A;  2:00pm   LAPAROSCOPY N/A 10/15/2014   Procedure: LAPAROSCOPY OPERATIVE;  Surgeon: Sherian Rein, MD;  Location: [facility];  Service: Gynecology;  Laterality: N/A;   OVARIAN CYST REMOVAL Right 10/15/2014   Procedure: OVARIAN CYSTECTOMY;  Surgeon: Sherian Rein, MD;  Location: [facility];  Service: Gynecology;  Laterality: Right;  Dr only needs 1hr OR time   WISDOM TOOTH EXTRACTION      OB History     Gravida  3   Para  1   Term  1   Preterm      AB      Living  1      SAB      IAB      Ectopic      Multiple  0   Live Births  1            Home Medications    Prior to Admission medications   Medication Sig Start Date End Date Taking? Authorizing Provider  aspirin EC 81 MG tablet Take 81 mg by mouth daily. Swallow whole.   Yes [provider]  Prenatal Vit-Fe Fumarate-FA (PRENATAL MULTIVITAMIN) TABS tablet Take 1 tablet by mouth daily at 12 noon.    [provider]  sertraline (ZOLOFT) 100 MG tablet Take 1 tablet (100 mg total) by mouth daily. 12/24/22   Gabriel Earing, FNP    Family History Family History  Problem Relation Age of Onset   Healthy Mother    Cancer Mother 63       Breast Cancer  Healthy Father    COPD Maternal Grandfather    Heart attack Paternal Grandfather    Colon cancer Neg Hx    Colon polyps Neg Hx     Social History Social History   Tobacco Use   Smoking status: Never   Smokeless tobacco: Never  Vaping Use   Vaping status: Never Used  Substance Use Topics   Alcohol use: Not Currently   Drug use: No     Allergies   Patient has no known allergies.   Review of Systems Review of Systems Per HPI  Physical Exam Triage Vital Signs ED Triage Vitals  Encounter Vitals Group     BP 11/09/23 0958 118/78     Systolic BP Percentile --      Diastolic BP Percentile --      Pulse Rate 11/09/23 0958 (!) 109     Resp 11/09/23 0958 18     Temp 11/09/23 0958 98.9 F (37.2 C)     Temp src --      SpO2 11/09/23 0958 98 %     Weight --      Height --      Head Circumference --       Peak Flow --      Pain Score 11/09/23 1001 5     Pain Loc --      Pain Education --      Exclude from Growth Chart --    No data found.  Updated Vital Signs BP 118/78 (BP Location: Right Arm)   Pulse (!) 109   Temp 98.9 F (37.2 C)   Resp 18   LMP 07/13/2022 (Approximate)   SpO2 98%   Visual Acuity Right Eye Distance:   Left Eye Distance:   Bilateral Distance:    Right Eye Near:   Left Eye Near:    Bilateral Near:     Physical Exam Vitals and nursing note reviewed.  Constitutional:      General: She is not in acute distress.    Appearance: She is well-developed.  HENT:     Head: Normocephalic.     Right Ear: Tympanic membrane and ear canal normal.     Left Ear: Tympanic membrane and ear canal normal.     Nose: Nose normal.     Mouth/Throat:     Lips: Pink.     Mouth: Mucous membranes are moist. Oral lesions (Ulcer appearing lesion inside of right lower lip) present.     Dentition: Does not have dentures. Gingival swelling present. No dental tenderness, dental caries, dental abscesses or gum lesions.     Tongue: No lesions.     Palate: No mass and lesions.     Pharynx: Uvula midline. Posterior oropharyngeal erythema present. No oropharyngeal exudate or uvula swelling.  Eyes:     Extraocular Movements: Extraocular movements intact.     Conjunctiva/sclera: Conjunctivae normal.     Pupils: Pupils are equal, round, and reactive to light.  Cardiovascular:     Rate and Rhythm: Regular rhythm. Tachycardia present.     Pulses: Normal pulses.     Heart sounds: Normal heart sounds.  Pulmonary:     Effort: Pulmonary effort is normal. No respiratory distress.     Breath sounds: Normal breath sounds. No stridor. No wheezing, rhonchi or rales.  Abdominal:     General: Bowel sounds are normal.     Palpations: Abdomen is soft.     Tenderness: There is no abdominal tenderness.  Musculoskeletal:  Cervical back: Normal range of motion.  Skin:    General: Skin is warm and  dry.  Neurological:     General: No focal deficit present.     Mental Status: She is alert and oriented to person, place, and time.  Psychiatric:        Mood and Affect: Mood normal.        Behavior: Behavior normal.      UC Treatments / Results  Labs (all labs ordered are listed, but only abnormal results are displayed) Labs Reviewed  CULTURE, GROUP A STREP [facility])  POCT RAPID STREP A (OFFICE)    EKG   Radiology No results found.  Procedures Procedures (including critical care time)  Medications Ordered in UC Medications - No data to display  Initial Impression / Assessment and Plan / UC Course  I have reviewed the triage vital signs and the nursing notes.  Pertinent labs & imaging results that were available during my care of the patient were reviewed by me and considered in my medical decision making (see chart for details).  Rapid strep test is negative, throat culture is pending.  Will treat for possible stomatitis with Magic mouthwash.  Patient is [redacted] weeks pregnant, advised patient to swish and spit the solution.  Supportive care recommendations were provided and discussed with the patient to include over-the-counter Tylenol, fluids, rest, and warm salt water gargles.  Patient was advised to follow-up with her PCP if symptoms fail to improve.  Patient was in agreement with this plan of care and verbalized understanding.  All questions were answered.  Patient stable for discharge.  Final Clinical Impressions(s) / UC Diagnoses   Final diagnoses:  Sore throat   Discharge Instructions   None    ED Prescriptions   None    PDMP not reviewed this encounter.   Abran Cantor, NP 11/09/23 1023

## 2023-11-12 LAB — CULTURE, GROUP A STREP (THRC)

## 2023-11-19 DIAGNOSIS — Z041 Encounter for examination and observation following transport accident: Secondary | ICD-10-CM | POA: Diagnosis not present

## 2023-11-27 DIAGNOSIS — Z361 Encounter for antenatal screening for raised alphafetoprotein level: Secondary | ICD-10-CM | POA: Diagnosis not present

## 2023-11-27 DIAGNOSIS — Z363 Encounter for antenatal screening for malformations: Secondary | ICD-10-CM | POA: Diagnosis not present

## 2023-12-13 ENCOUNTER — Encounter: Payer: Self-pay | Admitting: Cardiology

## 2023-12-13 ENCOUNTER — Ambulatory Visit (INDEPENDENT_AMBULATORY_CARE_PROVIDER_SITE_OTHER): Admitting: Cardiology

## 2023-12-13 VITALS — BP 102/70 | HR 100 | Ht 63.0 in | Wt 185.0 lb

## 2023-12-13 DIAGNOSIS — Z3A22 22 weeks gestation of pregnancy: Secondary | ICD-10-CM | POA: Diagnosis not present

## 2023-12-13 DIAGNOSIS — R002 Palpitations: Secondary | ICD-10-CM | POA: Diagnosis not present

## 2023-12-13 NOTE — Patient Instructions (Signed)
 Medication Instructions:  Your physician recommends that you continue on your current medications as directed. Please refer to the Current Medication list given to you today.  *If you need a refill on your cardiac medications before your next appointment, please call your pharmacy*  Testing/Procedures: [employer] Cardiac Event Monitor Instructions  Your physician has requested you wear your cardiac event monitor for 30 days.  [employer] may call or text to confirm a shipping address. The monitor will be sent to a land address via UPS. [employer] will not ship a monitor to a PO BOX. It typically takes 3-5 days to receive your monitor after it has been enrolled. [employer] will assist with USPS tracking if your package is delayed. The telephone number for [employer] is [phone]. Once you have received your monitor, please review the enclosed instructions. Instruction tutorials can also be viewed under help and settings on the enclosed cell phone. Your monitor has already been registered assigning a specific monitor serial # to you.  Billing and Self Pay Discount Information  [employer] has been provided the insurance information we had on file for you.  If your insurance has been updated, please call [employer] at [phone] to provide them with your updated insurance information.   [employer] offers a discounted Self Pay option for patients who have insurance that does not cover their cardiac event monitor or patients without insurance.  The discounted cost of a Self Pay Cardiac Event Monitor would be $225.00 , if the patient contacts [employer] at [phone] within 7 days of applying the monitor to make payment arrangements.  If the patient does not contact [employer] within 7 days of applying the monitor, the cost of the cardiac event monitor will be $350.00.  Applying the monitor  Remove cell phone from case and turn it on. The cell phone works as it consultant and needs to be  within [employer] of you at all times. The cell phone will need to be charged on a daily basis. We recommend you plug the cell phone into the enclosed charger at your bedside table every night.  Monitor batteries: You will receive two monitor batteries labelled #1 and #2. These are your recorders. Plug battery #2 onto the second connection on the enclosed charger. Keep one battery on the charger at all times. This will keep the monitor battery deactivated. It will also keep it fully charged for when you need to switch your monitor batteries. A small light will be blinking on the battery emblem when it is charging. The light on the battery emblem will remain on when the battery is fully charged.  Open package of a Monitor strip. Insert battery #1 into black hood on strip and gently squeeze monitor battery onto connection as indicated in instruction booklet. Set aside while preparing skin.  Choose location for your strip, vertical or horizontal, as indicated in the instruction booklet. Shave to remove all hair from location. There cannot be any lotions, oils, powders, or colognes on skin where monitor is to be applied. Wipe skin clean with enclosed Saline wipe. Dry skin completely.  Peel paper labeled #1 off the back of the Monitor strip exposing the adhesive. Place the monitor on the chest in the vertical or horizontal position shown in the instruction booklet. One arrow on the monitor strip must be pointing upward. Carefully remove paper labeled #2, attaching remainder of strip to your skin. Try not to create any folds or wrinkles in the strip as you apply it.  Firmly press and  release the circle in the center of the monitor battery. You will hear a small beep. This is turning the monitor battery on. The heart emblem on the monitor battery will light up every 5 seconds if the monitor battery in turned on and connected to the patient securely. Do not push and hold the circle down as this turns  the monitor battery off. The cell phone will locate the monitor battery. A screen will appear on the cell phone checking the connection of your monitor strip. This may read poor connection initially but change to good connection within the next minute. Once your monitor accepts the connection you will hear a series of 3 beeps followed by a climbing crescendo of beeps. A screen will appear on the cell phone showing the two monitor strip placement options. Touch the picture that demonstrates where you applied the monitor strip.  Your monitor strip and battery are waterproof. You are able to shower, bathe, or swim with the monitor on. They just ask you do not submerge deeper than 3 feet underwater. We recommend removing the monitor if you are swimming in a lake, river, or ocean.  Your monitor battery will need to be switched to a fully charged monitor battery approximately once a week. The cell phone will alert you of an action which needs to be made.  On the cell phone, tap for details to reveal connection status, monitor battery status, and cell phone battery status. The green dots indicates your monitor is in good status. A red dot indicates there is something that needs your attention.  To record a symptom, click the circle on the monitor battery. In 30-60 seconds a list of symptoms will appear on the cell phone. Select your symptom and tap save. Your monitor will record a sustained or significant arrhythmia regardless of you clicking the button. Some patients do not feel the heart rhythm irregularities. [employer] will notify us  of any serious or critical events.  Refer to instruction booklet for instructions on switching batteries, changing strips, the Do not disturb or Pause features, or any additional questions.  Call [employer] at [phone], to confirm your monitor is transmitting and record your baseline. They will answer any questions you may have regarding the monitor  instructions at that time.  Returning the monitor to [employer]  Place all equipment back into blue box. Peel off strip of paper to expose adhesive and close box securely. There is a prepaid UPS shipping label on this box. Drop in a UPS drop box, or at a UPS facility like [employer]. You may also contact [employer] to arrange UPS to pick up monitor package at your home.   Follow-Up: At [facility], you and your health needs are our priority.  As part of our continuing mission to provide you with exceptional heart care, our providers are all part of one team.  This team includes your primary Cardiologist (physician) and Advanced Practice Providers or APPs (Physician Assistants and Nurse Practitioners) who all work together to provide you with the care you need, when you need it.  Your next appointment:   16 week(s)  Provider:   Kardie Tobb, DO

## 2023-12-13 NOTE — Progress Notes (Signed)
 Cardio-Obstetrics Clinic  New Evaluation  Date:  12/15/2023   ID:  Ayaka, Andes 1995-09-08, MRN [id number]  PCP:  Albertha Huger, FNP   [facility] Providers Cardiologist:  Jann Melody, MD  Electrophysiologist:  None       Referring MD: Olin Bertin, DO   Chief Complaint: " I am having   History of Present Illness:    Anne Underwood is a 28 y.o. female [G3P1001] who is being seen today for the evaluation of palpitations at the request of Law, Cassandra A, DO.   She is a patient of Dr. Paulita Boss and last saw him in 04/2021. At that time she was experiencing worsening palpitations.   She was referred to the cardioobstetrics clinic due to worsening palpitations.  She reports history of SVT and prior C-section.  She presents at [redacted] weeks gestation with recurrent episodes of palpitations and lightheadedness. She describes feeling 'really sick' and 'like I'm gonna pass out' during these episodes. She denies any correlation with activity level, stating that her heart rate is 'always over one forty' even when she is not doing anything to provoke it. She has attempted to monitor her blood pressure and blood sugar during these episodes, but both have been within normal limits. She has worn a heart monitor in the past, but it was unable to capture an episode and caused skin irritation. She also reports a history of iron  deficiency requiring transfusions after her first delivery, but she is not currently concerned about her iron  levels.   Prior CV Studies Reviewed: The following studies were reviewed today:   Past Medical History:  Diagnosis Date   ADD (attention deficit disorder)    Anxiety    Family history of adverse reaction to anesthesia    MOTHER HAD PONV   Ovarian cyst, follicular 10/14/2014   Pelvic pain in female 10/14/2014    Past Surgical History:  Procedure Laterality Date   CESAREAN SECTION N/A 07/01/2021   Procedure: CESAREAN SECTION;   Surgeon: Olin Bertin, DO;  Location: [facility];  Service: Obstetrics;  Laterality: N/A;   COLONOSCOPY N/A 07/04/2018   Procedure: COLONOSCOPY;  Surgeon: Alyce Jubilee, MD;  Location: [facility]O SU[facility];  Service: Endoscopy;  Laterality: N/A;  2:00pm   LAPAROSCOPY N/A 10/15/2014   Procedure: LAPAROSCOPY OPERATIVE;  Surgeon: Margaretmary Shaver, MD;  Location: [facility] ORS;  Service: Gynecology;  Laterality: N/A;   OVARIAN CYST REMOVAL Right 10/15/2014   Procedure: OVARIAN CYSTECTOMY;  Surgeon: Margaretmary Shaver, MD;  Location: [facility] ORS;  Service: Gynecology;  Laterality: Right;  Dr only needs 1hr OR time   WISDOM TOOTH EXTRACTION        OB History     Gravida  3   Para  1   Term  1   Preterm      AB      Living  1      SAB      IAB      Ectopic      Multiple  0   Live Births  1               Current Medications: Current Meds  Medication Sig   aspirin EC 81 MG tablet Take 81 mg by mouth daily. Swallow whole.   Prenatal Vit-Fe Fumarate-FA (PRENATAL MULTIVITAMIN) TABS tablet Take 1 tablet by mouth daily at 12 noon.     Allergies:   Patient has no known allergies.   Social History  Socioeconomic History   Marital status: Single    Spouse name: Not on file   Number of children: 0   Years of education: 16   Highest education level: Bachelor's degree (e.g., BA, AB, BS)  Occupational History   Occupation: Hospital doctor: [employer]  Tobacco Use   Smoking status: Never   Smokeless tobacco: Never  Vaping Use   Vaping status: Never Used  Substance and Sexual Activity   Alcohol use: Not Currently   Drug use: No   Sexual activity: Yes    Birth control/protection: None    Comment: Current Pregnancy  Other Topics Concern   Not on file  Social History Narrative   [employer]   Social Drivers of Health   Financial Resource Strain: Low Risk  (02/04/2023)   Overall Financial Resource Strain (CARDIA)    Difficulty of Paying Living Expenses: Not  hard at all  Food Insecurity: No Food Insecurity (02/04/2023)   Hunger Vital Sign    Worried About Running Out of Food in the Last Year: Never true    Ran Out of Food in the Last Year: Never true  Transportation Needs: No Transportation Needs (02/04/2023)   PRAPARE - Administrator, Civil Service (Medical): No    Lack of Transportation (Non-Medical): No  Physical Activity: Insufficiently Active (02/04/2023)   Exercise Vital Sign    Days of Exercise per Week: 3 days    Minutes of Exercise per Session: 30 min  Stress: No Stress Concern Present (02/04/2023)   [employer] of Occupational Health - Occupational Stress Questionnaire    Feeling of Stress : Only a little  Social Connections: Moderately Isolated (02/04/2023)   Social Connection and Isolation Panel [NHANES]    Frequency of Communication with Friends and Family: More than three times a week    Frequency of Social Gatherings with Friends and Family: More than three times a week    Attends Religious Services: Never    Database administrator or Organizations: No    Attends Engineer, structural: Not on file    Marital Status: Married      Family History  Problem Relation Age of Onset   Healthy Mother    Cancer Mother 4       Breast Cancer   Healthy Father    COPD Maternal Grandfather    Heart attack Paternal Grandfather    Colon cancer Neg Hx    Colon polyps Neg Hx       ROS:   Please see the history of present illness.    Reports palpitations All other systems reviewed and are negative.   Labs/EKG Reviewed:    EKG:  None today    Recent Labs: 03/25/2023: Hemoglobin 13.6; Platelets 264   Recent Lipid Panel No results found for: "CHOL", "TRIG", "HDL", "CHOLHDL", "LDLCALC", "LDLDIRECT"  Physical Exam:    VS:  BP 102/70 (BP Location: Left Arm, Patient Position: Sitting)   Pulse 100   Ht 5\' 3"  (1.6 m)   Wt 185 lb (83.9 kg)   LMP 07/13/2022 (Approximate)   SpO2 96%   BMI 32.77 kg/m      Wt Readings from Last 3 Encounters:  12/13/23 185 lb (83.9 kg)  05/13/23 185 lb 9.6 oz (84.2 kg)  03/04/23 181 lb (82.1 kg)     GEN:  Well nourished, well developed in no acute distress HEENT: Normal NECK: No JVD; No carotid bruits LYMPHATICS: No lymphadenopathy CARDIAC: RRR, no  murmurs, rubs, gallops RESPIRATORY:  Clear to auscultation without rales, wheezing or rhonchi  ABDOMEN: Soft, non-tender, non-distended MUSCULOSKELETAL:  No edema; No deformity  SKIN: Warm and dry NEUROLOGIC:  Alert and oriented x 3 PSYCHIATRIC:  Normal affect    Risk Assessment/Risk Calculators:                 ASSESSMENT & PLAN:    Suspected supraventricular tachycardia-episodes of tachycardia going over 140-150 with nausea and lightheadedness dizziness.  She is concerned about this because this has become more frequent since she has been pregnant.  Will place a 30-day monitor on the patient at this time.  In the meantime with her lightheadedness we will monitor her closely as this could also could be IVC compression in the setting of her pregnancy. Will follow-up with her in 16 weeks.  Of asked the patient to take her blood pressure daily after 2 weeks share this with my office.  She follows regularly with Dr. Bonny Button and post partum we will transition her care back to her cardiologist.    Patient Instructions  Medication Instructions:  Your physician recommends that you continue on your current medications as directed. Please refer to the Current Medication list given to you today.  *If you need a refill on your cardiac medications before your next appointment, please call your pharmacy*  Testing/Procedures: [employer] Cardiac Event Monitor Instructions  Your physician has requested you wear your cardiac event monitor for 30 days.  [employer] may call or text to confirm a shipping address. The monitor will be sent to a land address via UPS. [employer] will not ship a monitor to a  PO BOX. It typically takes 3-5 days to receive your monitor after it has been enrolled. [employer] will assist with USPS tracking if your package is delayed. The telephone number for [employer] is [phone]. Once you have received your monitor, please review the enclosed instructions. Instruction tutorials can also be viewed under help and settings on the enclosed cell phone. Your monitor has already been registered assigning a specific monitor serial # to you.  Billing and Self Pay Discount Information  [employer] has been provided the insurance information we had on file for you.  If your insurance has been updated, please call [employer] at [phone] to provide them with your updated insurance information.   [employer] offers a discounted Self Pay option for patients who have insurance that does not cover their cardiac event monitor or patients without insurance.  The discounted cost of a Self Pay Cardiac Event Monitor would be $225.00 , if the patient contacts [employer] at [phone] within 7 days of applying the monitor to make payment arrangements.  If the patient does not contact [employer] within 7 days of applying the monitor, the cost of the cardiac event monitor will be $350.00.  Applying the monitor  Remove cell phone from case and turn it on. The cell phone works as IT consultant and needs to be within [employer] of you at all times. The cell phone will need to be charged on a daily basis. We recommend you plug the cell phone into the enclosed charger at your bedside table every night.  Monitor batteries: You will receive two monitor batteries labelled #1 and #2. These are your recorders. Plug battery #2 onto the second connection on the enclosed charger. Keep one battery on the charger at all times. This will keep the monitor battery deactivated. It will also keep it fully charged for when you need to  switch your monitor batteries. A small light will be blinking on the  battery emblem when it is charging. The light on the battery emblem will remain on when the battery is fully charged.  Open package of a Monitor strip. Insert battery #1 into black hood on strip and gently squeeze monitor battery onto connection as indicated in instruction booklet. Set aside while preparing skin.  Choose location for your strip, vertical or horizontal, as indicated in the instruction booklet. Shave to remove all hair from location. There cannot be any lotions, oils, powders, or colognes on skin where monitor is to be applied. Wipe skin clean with enclosed Saline wipe. Dry skin completely.  Peel paper labeled #1 off the back of the Monitor strip exposing the adhesive. Place the monitor on the chest in the vertical or horizontal position shown in the instruction booklet. One arrow on the monitor strip must be pointing upward. Carefully remove paper labeled #2, attaching remainder of strip to your skin. Try not to create any folds or wrinkles in the strip as you apply it.  Firmly press and release the circle in the center of the monitor battery. You will hear a small beep. This is turning the monitor battery on. The heart emblem on the monitor battery will light up every 5 seconds if the monitor battery in turned on and connected to the patient securely. Do not push and hold the circle down as this turns the monitor battery off. The cell phone will locate the monitor battery. A screen will appear on the cell phone checking the connection of your monitor strip. This may read poor connection initially but change to good connection within the next minute. Once your monitor accepts the connection you will hear a series of 3 beeps followed by a climbing crescendo of beeps. A screen will appear on the cell phone showing the two monitor strip placement options. Touch the picture that demonstrates where you applied the monitor strip.  Your monitor strip and battery are waterproof. You  are able to shower, bathe, or swim with the monitor on. They just ask you do not submerge deeper than 3 feet underwater. We recommend removing the monitor if you are swimming in a lake, river, or ocean.  Your monitor battery will need to be switched to a fully charged monitor battery approximately once a week. The cell phone will alert you of an action which needs to be made.  On the cell phone, tap for details to reveal connection status, monitor battery status, and cell phone battery status. The green dots indicates your monitor is in good status. A red dot indicates there is something that needs your attention.  To record a symptom, click the circle on the monitor battery. In 30-60 seconds a list of symptoms will appear on the cell phone. Select your symptom and tap save. Your monitor will record a sustained or significant arrhythmia regardless of you clicking the button. Some patients do not feel the heart rhythm irregularities. [employer] will notify us  of any serious or critical events.  Refer to instruction booklet for instructions on switching batteries, changing strips, the Do not disturb or Pause features, or any additional questions.  Call [employer] at [phone], to confirm your monitor is transmitting and record your baseline. They will answer any questions you may have regarding the monitor instructions at that time.  Returning the monitor to [employer]  Place all equipment back into blue box. Peel off strip of paper to expose adhesive and  close box securely. There is a prepaid UPS shipping label on this box. Drop in a UPS drop box, or at a UPS facility like [employer]. You may also contact [employer] to arrange UPS to pick up monitor package at your home.   Follow-Up: At [facility], you and your health needs are our priority.  As part of our continuing mission to provide you with exceptional heart care, our providers are all part of one team.  This team  includes your primary Cardiologist (physician) and Advanced Practice Providers or APPs (Physician Assistants and Nurse Practitioners) who all work together to provide you with the care you need, when you need it.  Your next appointment:   16 week(s)  Provider:   Mozetta Murfin, DO     Dispo:  No follow-ups on file.   Medication Adjustments/Labs and Tests Ordered: Current medicines are reviewed at length with the patient today.  Concerns regarding medicines are outlined above.  Tests Ordered: Orders Placed This Encounter  Procedures   Cardiac event monitor   Medication Changes: No orders of the defined types were placed in this encounter.

## 2023-12-19 DIAGNOSIS — R002 Palpitations: Secondary | ICD-10-CM | POA: Diagnosis not present

## 2023-12-19 DIAGNOSIS — Z362 Encounter for other antenatal screening follow-up: Secondary | ICD-10-CM | POA: Diagnosis not present

## 2024-01-13 DIAGNOSIS — Z3689 Encounter for other specified antenatal screening: Secondary | ICD-10-CM | POA: Diagnosis not present

## 2024-01-22 ENCOUNTER — Encounter: Payer: Self-pay | Admitting: *Deleted

## 2024-01-22 NOTE — Progress Notes (Signed)
 30 day [employer] Cardiac event monitor was only worn for 13 hours.  Museum/gallery curator contacted to cancel charges on the monitor.  Order will be cancelled.

## 2024-02-03 DIAGNOSIS — Z3689 Encounter for other specified antenatal screening: Secondary | ICD-10-CM | POA: Diagnosis not present

## 2024-02-03 DIAGNOSIS — Z23 Encounter for immunization: Secondary | ICD-10-CM | POA: Diagnosis not present

## 2024-02-25 DIAGNOSIS — Z3A32 32 weeks gestation of pregnancy: Secondary | ICD-10-CM | POA: Diagnosis not present

## 2024-02-25 DIAGNOSIS — O368130 Decreased fetal movements, third trimester, not applicable or unspecified: Secondary | ICD-10-CM | POA: Diagnosis not present

## 2024-03-03 ENCOUNTER — Other Ambulatory Visit: Payer: Self-pay | Admitting: Obstetrics & Gynecology

## 2024-03-17 DIAGNOSIS — Z3685 Encounter for antenatal screening for Streptococcus B: Secondary | ICD-10-CM | POA: Diagnosis not present

## 2024-03-17 LAB — OB RESULTS CONSOLE GBS: GBS: NEGATIVE

## 2024-03-24 DIAGNOSIS — Z3483 Encounter for supervision of other normal pregnancy, third trimester: Secondary | ICD-10-CM | POA: Diagnosis not present

## 2024-03-24 DIAGNOSIS — N898 Other specified noninflammatory disorders of vagina: Secondary | ICD-10-CM | POA: Diagnosis not present

## 2024-03-26 ENCOUNTER — Inpatient Hospital Stay (HOSPITAL_COMMUNITY)
Admission: AD | Admit: 2024-03-26 | Discharge: 2024-03-26 | Disposition: A | Discharge status: Home / Self Care | Attending: Obstetrics & Gynecology | Admitting: Obstetrics & Gynecology

## 2024-03-26 ENCOUNTER — Encounter (HOSPITAL_COMMUNITY): Payer: Self-pay | Admitting: Obstetrics & Gynecology

## 2024-03-26 DIAGNOSIS — R197 Diarrhea, unspecified: Secondary | ICD-10-CM

## 2024-03-26 DIAGNOSIS — O212 Late vomiting of pregnancy: Secondary | ICD-10-CM | POA: Insufficient documentation

## 2024-03-26 DIAGNOSIS — O26893 Other specified pregnancy related conditions, third trimester: Secondary | ICD-10-CM

## 2024-03-26 DIAGNOSIS — O99891 Other specified diseases and conditions complicating pregnancy: Secondary | ICD-10-CM | POA: Insufficient documentation

## 2024-03-26 DIAGNOSIS — R109 Unspecified abdominal pain: Secondary | ICD-10-CM | POA: Diagnosis not present

## 2024-03-26 DIAGNOSIS — R112 Nausea with vomiting, unspecified: Secondary | ICD-10-CM

## 2024-03-26 DIAGNOSIS — Z3A36 36 weeks gestation of pregnancy: Secondary | ICD-10-CM

## 2024-03-26 DIAGNOSIS — O26899 Other specified pregnancy related conditions, unspecified trimester: Secondary | ICD-10-CM

## 2024-03-26 LAB — CBC
HCT: 31.8 % — ABNORMAL LOW (ref 36.0–46.0)
Hemoglobin: 10.2 g/dL — ABNORMAL LOW (ref 12.0–15.0)
MCH: 25.9 pg — ABNORMAL LOW (ref 26.0–34.0)
MCHC: 32.1 g/dL (ref 30.0–36.0)
MCV: 80.7 fL (ref 80.0–100.0)
Platelets: 210 K/uL (ref 150–400)
RBC: 3.94 MIL/uL (ref 3.87–5.11)
RDW: 14.8 % (ref 11.5–15.5)
WBC: 15.3 K/uL — ABNORMAL HIGH (ref 4.0–10.5)
nRBC: 0 % (ref 0.0–0.2)

## 2024-03-26 LAB — URINALYSIS, ROUTINE W REFLEX MICROSCOPIC
Bilirubin Urine: NEGATIVE
Glucose, UA: NEGATIVE mg/dL
Hgb urine dipstick: NEGATIVE
Ketones, ur: 5 mg/dL — AB
Nitrite: NEGATIVE
Protein, ur: NEGATIVE mg/dL
Specific Gravity, Urine: 1.023 (ref 1.005–1.030)
pH: 5 (ref 5.0–8.0)

## 2024-03-26 MED ORDER — ONDANSETRON 4 MG PO TBDP
4.0000 mg | ORAL_TABLET | Freq: Once | ORAL | Status: AC
  Administered 2024-03-26: 4 mg via ORAL
  Filled 2024-03-26: qty 1

## 2024-03-26 MED ORDER — HYOSCYAMINE SULFATE 0.125 MG SL SUBL
0.1250 mg | SUBLINGUAL_TABLET | Freq: Once | SUBLINGUAL | Status: AC
  Administered 2024-03-26: 0.125 mg via SUBLINGUAL
  Filled 2024-03-26: qty 1

## 2024-03-26 MED ORDER — ONDANSETRON 4 MG PO TBDP
4.0000 mg | ORAL_TABLET | Freq: Three times a day (TID) | ORAL | 0 refills | Status: DC | PRN
Start: 2024-03-26 — End: 2024-04-22

## 2024-03-26 NOTE — MAU Provider Note (Signed)
 Chief Complaint:  Abdominal Pain, Nausea, and Emesis   Event Date/Time   First Provider Initiated Contact with Patient 03/26/24 [id number]     HPI  HPI: Anne Underwood is a 28 y.o. G4P1001 at 59w4dwho presents to maternity admissions reporting sudden onset tonight of nausea, vomiting and diarrhea.  No one else has been sick   Has some pain in right side of abdomen.  That feels like her ovarian cysts in the past. . She reports good fetal movement, denies LOF, denies vaginal bleeding, urinary symptoms, h/a, dizziness,  or fever/chills.   RN Note:  Anne Underwood is a 28 y.o. at [redacted]w[redacted]d here in MAU reporting: awakened about an hour ago with severe nausea and projectile vomiting and diarrhea. Reports occasional uc's but a consistent pain in right lower quadrant that feels like her ovarian cysts have felt in the past. Denies bleeding. Reports positive fetal movement.  Onset of complaint: one hour ago Pain score: 5/10      Past Medical History: Past Medical History:  Diagnosis Date   ADD (attention deficit disorder)    Anxiety    Family history of adverse reaction to anesthesia    MOTHER HAD PONV   Ovarian cyst, follicular 10/14/2014   Pelvic pain in female 10/14/2014    Past obstetric history: OB History  Gravida Para Term Preterm AB Living  4 1 1   1   SAB IAB Ectopic Multiple Live Births     0 1    # Outcome Date GA Lbr Len/2nd Weight Sex Type Anes PTL Lv  4 Current           3 Term 07/01/21 [redacted]w[redacted]d  3870 g F CS-LTranv EPI  LIV  2 Gravida           1 Gravida             Past Surgical History: Past Surgical History:  Procedure Laterality Date   CESAREAN SECTION N/A 07/01/2021   Procedure: CESAREAN SECTION;  Surgeon: Rendell Calton LABOR, DO;  Location: [facility] LD OR[facility];  Service: Obstetrics;  Laterality: N/A;   COLONOSCOPY N/A 07/04/2018   Procedure: COLONOSCOPY;  Surgeon: Harvey Margo CROME, MD;  Location: [facility];  Service: Endoscopy;  Laterality: N/A;  2:00pm   LAPAROSCOPY N/A 10/15/2014    Procedure: LAPAROSCOPY OPERATIVE;  Surgeon: Ezzie Buba, MD;  Location: [facility] ORS;  Service: Gynecology;  Laterality: N/A;   OVARIAN CYST REMOVAL Right 10/15/2014   Procedure: OVARIAN CYSTECTOMY;  Surgeon: Ezzie Buba, MD;  Location: [facility]S;  Service: Gynecology;  Laterality: Right;  Dr only needs 1hr OR time   WISDOM TOOTH EXTRACTION      Family History: Family History  Problem Relation Age of Onset   Healthy Mother    Cancer Mother 65       Breast Cancer   Healthy Father    COPD Maternal Grandfather    Heart attack Paternal Grandfather    Colon cancer Neg Hx    Colon polyps Neg Hx     Social History: Social History   Tobacco Use   Smoking status: Never   Smokeless tobacco: Never  Vaping Use   Vaping status: Never Used  Substance Use Topics   Alcohol use: Not Currently   Drug use: No    Allergies: No Known Allergies  Meds:  Medications Prior to Admission  Medication Sig Dispense Refill Last Dose/Taking   aspirin EC 81 MG tablet Take 81 mg by mouth daily. Swallow whole.  03/25/2024   Prenatal Vit-Fe Fumarate-FA (PRENATAL MULTIVITAMIN) TABS tablet Take 1 tablet by mouth daily at 12 noon.   03/25/2024   magic mouthwash (nystatin , hydrocortisone, diphenhydrAMINE , lidocaine ) suspension Swish and spit 15 mLs 4 (four) times daily as needed for mouth pain. (Patient not taking: Reported on 12/13/2023) 540 mL 0    sertraline  (ZOLOFT ) 100 MG tablet Take 1 tablet (100 mg total) by mouth daily. (Patient not taking: Reported on 12/13/2023) 90 tablet 3     I have reviewed patient's Past Medical Hx, Surgical Hx, Family Hx, Social Hx, medications and allergies.   ROS:  Review of Systems  Constitutional:  Negative for chills and fever.  Respiratory:  Negative for shortness of breath.   Gastrointestinal:  Positive for abdominal pain, diarrhea, nausea and vomiting. Negative for abdominal distention.  Genitourinary:  Negative for vaginal bleeding.   Other systems  negative  Physical Exam  Patient Vitals for the past 24 hrs:  BP Temp Temp src Pulse Resp SpO2 Height Weight  03/26/24 0311 115/68 98.4 F (36.9 C) Oral (!) 109 17 99 % -- --  03/26/24 0300 -- -- -- -- -- -- 5' 3 (1.6 m) 91.6 kg   Constitutional: Well-developed, well-nourished female in no acute distress.  Cardiovascular: normal rate and rhythm Respiratory: normal effort, clear to auscultation bilaterally GI: Abd soft, non-tender, No rebound or guarding.  Abdomen is gravid appropriate for gestational age.   No rebound or guarding. MS: Extremities nontender, no edema, normal ROM Neurologic: Alert and oriented x 4. No CVA tenderness  PELVIC EXAM: deferred, patient declined.     FHT:  Baseline 140 , moderate variability, accelerations present, no decelerations Contractions: Irregular     Labs: Results for orders placed or performed during the hospital encounter of 03/26/24 (from the past 24 hours)  Urinalysis, Routine w reflex microscopic -Urine, Clean Catch     Status: Abnormal   Collection Time: 03/26/24  3:16 AM  Result Value Ref Range   Color, Urine YELLOW YELLOW   APPearance HAZY (A) CLEAR   Specific Gravity, Urine 1.023 1.005 - 1.030   pH 5.0 5.0 - 8.0   Glucose, UA NEGATIVE NEGATIVE mg/dL   Hgb urine dipstick NEGATIVE NEGATIVE   Bilirubin Urine NEGATIVE NEGATIVE   Ketones, ur 5 (A) NEGATIVE mg/dL   Protein, ur NEGATIVE NEGATIVE mg/dL   Nitrite NEGATIVE NEGATIVE   Leukocytes,Ua TRACE (A) NEGATIVE   RBC / HPF 0-5 0 - 5 RBC/hpf   WBC, UA 6-10 0 - 5 WBC/hpf   Bacteria, UA RARE (A) NONE SEEN   Squamous Epithelial / HPF 6-10 0 - 5 /HPF   Mucus PRESENT   CBC     Status: Abnormal   Collection Time: 03/26/24  4:13 AM  Result Value Ref Range   WBC 15.3 (H) 4.0 - 10.5 K/uL   RBC 3.94 3.87 - 5.11 MIL/uL   Hemoglobin 10.2 (L) 12.0 - 15.0 g/dL   HCT 68.1 (L) 63.9 - 53.9 %   MCV 80.7 80.0 - 100.0 fL   MCH 25.9 (L) 26.0 - 34.0 pg   MCHC 32.1 30.0 - 36.0 g/dL   RDW 85.1 88.4  - 84.4 %   Platelets 210 150 - 400 K/uL   nRBC 0.0 0.0 - 0.2 %     Imaging:  No results found.  MAU Course/MDM: I have reviewed the triage vital signs and the nursing notes.   Pertinent labs & imaging results that were available during my care of the patient  were reviewed by me and considered in my medical decision making (see chart for details).      I have reviewed her medical records including past results, notes and treatments.   I have ordered labs and reviewed results. No significant leukocytosis to suggest appendicitis.  MIld leukocytosis, likely due to viral gastroenteritist.  NST reviewed Treatments in MAU included EFN, Levsin  for cramping which helped some. Zofran  given for nausea.    Assessment: Single IUP at [redacted]w[redacted]d Naustea, vomiting and diarrhea Likely viral gastroenteritis.   Plan: Discharge home Labor precautions and fetal kick counts Follow up in Office for prenatal visits and recheck   Pt stable at time of discharge.  Earnie Pouch CNM, MSN Certified Nurse-Midwife 03/26/2024 3:37 AM

## 2024-03-26 NOTE — MAU Note (Signed)
 Anne Underwood is a 28 y.o. at [redacted]w[redacted]d here in MAU reporting: awakened about an hour ago with severe nausea and projectile vomiting and diarrhea. Reports occasional uc's but a consistent pain in right lower quadrant that feels like her ovarian cysts have felt in the past. Denies bleeding. Reports positive fetal movement.  Onset of complaint: one hour ago Pain score: 5/10 Vitals:   03/26/24 0311  BP: 115/68  Pulse: (!) 109  Resp: 17  Temp: 98.4 F (36.9 C)  SpO2: 99%     FHT: 152  Lab orders placed from triage: ua

## 2024-04-03 ENCOUNTER — Ambulatory Visit: Admitting: Cardiology

## 2024-04-08 ENCOUNTER — Encounter (HOSPITAL_COMMUNITY): Payer: Self-pay | Admitting: *Deleted

## 2024-04-08 DIAGNOSIS — O368130 Decreased fetal movements, third trimester, not applicable or unspecified: Secondary | ICD-10-CM | POA: Diagnosis not present

## 2024-04-08 DIAGNOSIS — Z3A38 38 weeks gestation of pregnancy: Secondary | ICD-10-CM | POA: Diagnosis not present

## 2024-04-08 NOTE — Patient Instructions (Signed)
 Anne Underwood  04/08/2024   Your procedure is scheduled on:  04/22/2024  Arrive at 1100 at Entrance C on [address] at [facility]  and [employer]. You are invited to use the FREE valet parking or use the Visitor's parking deck.  Pick up the phone at the desk and dial [phone].  Call this number if you have problems the morning of surgery: [phone]  Remember:   Do not eat food:(After Midnight) Desps de medianoche.  You may drink clear liquids until  ___0900__.  Clear liquids means a liquid you can see thru.  It can have color such as Cola or Kool aid.  Tea is OK and coffee as long as no milk or creamer of any kind.  Take these medicines the morning of surgery with A SIP OF WATER:  none   Do not wear jewelry, make-up or nail polish.  Do not wear lotions, powders, or perfumes. Do not wear deodorant.  Do not shave 48 hours prior to surgery.  Do not bring valuables to the hospital.  [facility] is not   responsible for any belongings or valuables brought to the hospital.  Contacts, dentures or bridgework may not be worn into surgery.  Leave suitcase in the car. After surgery it may be brought to your room.  For patients admitted to the hospital, checkout time is 11:00 AM the day of              discharge.      Please read over the following fact sheets that you were given:     Preparing for Surgery

## 2024-04-09 ENCOUNTER — Encounter (HOSPITAL_COMMUNITY): Payer: Self-pay

## 2024-04-15 DIAGNOSIS — Z3A39 39 weeks gestation of pregnancy: Secondary | ICD-10-CM | POA: Diagnosis not present

## 2024-04-15 DIAGNOSIS — O368130 Decreased fetal movements, third trimester, not applicable or unspecified: Secondary | ICD-10-CM | POA: Diagnosis not present

## 2024-04-20 ENCOUNTER — Encounter (HOSPITAL_COMMUNITY)
Admission: RE | Admit: 2024-04-20 | Discharge: 2024-04-20 | Disposition: A | Discharge status: Home / Self Care | Source: Ambulatory Visit | Attending: Obstetrics & Gynecology | Admitting: Obstetrics & Gynecology

## 2024-04-20 DIAGNOSIS — Z3A00 Weeks of gestation of pregnancy not specified: Secondary | ICD-10-CM | POA: Diagnosis not present

## 2024-04-20 DIAGNOSIS — Z8249 Family history of ischemic heart disease and other diseases of the circulatory system: Secondary | ICD-10-CM | POA: Diagnosis not present

## 2024-04-20 DIAGNOSIS — O99214 Obesity complicating childbirth: Secondary | ICD-10-CM | POA: Diagnosis not present

## 2024-04-20 DIAGNOSIS — O34211 Maternal care for low transverse scar from previous cesarean delivery: Secondary | ICD-10-CM | POA: Diagnosis not present

## 2024-04-20 DIAGNOSIS — Z3A40 40 weeks gestation of pregnancy: Secondary | ICD-10-CM | POA: Diagnosis not present

## 2024-04-20 DIAGNOSIS — Z01812 Encounter for preprocedural laboratory examination: Secondary | ICD-10-CM | POA: Insufficient documentation

## 2024-04-20 DIAGNOSIS — Q826 Congenital sacral dimple: Secondary | ICD-10-CM | POA: Diagnosis not present

## 2024-04-20 DIAGNOSIS — O34219 Maternal care for unspecified type scar from previous cesarean delivery: Secondary | ICD-10-CM | POA: Diagnosis not present

## 2024-04-20 DIAGNOSIS — D62 Acute posthemorrhagic anemia: Secondary | ICD-10-CM | POA: Diagnosis not present

## 2024-04-20 DIAGNOSIS — O9081 Anemia of the puerperium: Secondary | ICD-10-CM | POA: Diagnosis not present

## 2024-04-20 DIAGNOSIS — Z23 Encounter for immunization: Secondary | ICD-10-CM | POA: Diagnosis not present

## 2024-04-20 LAB — CBC
HCT: 31.4 % — ABNORMAL LOW (ref 36.0–46.0)
Hemoglobin: 9.7 g/dL — ABNORMAL LOW (ref 12.0–15.0)
MCH: 24.9 pg — ABNORMAL LOW (ref 26.0–34.0)
MCHC: 30.9 g/dL (ref 30.0–36.0)
MCV: 80.5 fL (ref 80.0–100.0)
Platelets: 217 K/uL (ref 150–400)
RBC: 3.9 MIL/uL (ref 3.87–5.11)
RDW: 16.6 % — ABNORMAL HIGH (ref 11.5–15.5)
WBC: 9.7 K/uL (ref 4.0–10.5)
nRBC: 0 % (ref 0.0–0.2)

## 2024-04-20 LAB — TYPE AND SCREEN
ABO/RH(D): A POS
Antibody Screen: NEGATIVE

## 2024-04-21 LAB — RPR: RPR Ser Ql: NONREACTIVE

## 2024-04-21 NOTE — H&P (Signed)
 Anne Underwood is a 28 y.o. female presenting for repeat C/section at 40.3 wks was hoping for labor and TOLAC but declined IOL.  G1 was C?s for fetal decels, 8'9  Uncomplicated pregnancy. PNLabs, NIPT low risk girl..  nl anatomy. No GDM or GHTN. Few visits in 3rd trim for decr FMs and got NST and BPP all nl. GBS neg . OB History     Gravida  4   Para  1   Term  1   Preterm      AB  2   Living  1      SAB  2   IAB      Ectopic      Multiple  0   Live Births  1          Past Medical History:  Diagnosis Date   ADD (attention deficit disorder)    Anxiety    Family history of adverse reaction to anesthesia    MOTHER HAD PONV   Ovarian cyst, follicular 10/14/2014   Pelvic pain in female 10/14/2014   Past Surgical History:  Procedure Laterality Date   CESAREAN SECTION N/A 07/01/2021   Procedure: CESAREAN SECTION;  Surgeon: Rendell Ruby A, DO;  Location: [facility];  Service: Obstetrics;  Laterality: N/A;   COLONOSCOPY N/A 07/04/2018   Procedure: COLONOSCOPY;  Surgeon: Harvey Margo CROME, MD;  Location: [facility];  Service: Endoscopy;  Laterality: N/A;  2:00pm   LAPAROSCOPY N/A 10/15/2014   Procedure: LAPAROSCOPY OPERATIVE;  Surgeon: Ezzie Buba, MD;  Location: [facility] ORS;  Service: Gynecology;  Laterality: N/A;   OVARIAN CYST REMOVAL Right 10/15/2014   Procedure: OVARIAN CYSTECTOMY;  Surgeon: Ezzie Buba, MD;  Location: [facility] ORS;  Service: Gynecology;  Laterality: Right;  Dr only needs 1hr OR time   WISDOM TOOTH EXTRACTION     Family History: family history includes COPD in her maternal grandfather; Cancer (age of onset: 13) in her mother; Healthy in her father and mother; Heart attack in her paternal grandfather. Social History:  reports that she has never smoked. She has never used smokeless tobacco. She reports that she does not currently use alcohol. She reports that she does not use drugs.     Maternal Diabetes: No Genetic Screening:  Normal Maternal Ultrasounds/Referrals: Normal Fetal Ultrasounds or other Referrals:  None Maternal Substance Abuse:  No Significant Maternal Medications:  bbASA Significant Maternal Lab Results:  Group B Strep negative Number of Prenatal Visits:greater than 3 verified prenatal visits Maternal Vaccinations:TDap Other Comments:  None  Review of Systems History   Exam Physical Exam  LMP 07/13/2022 (Approximate)  BP 117/81 (BP Location: Left Arm)   Pulse 96   Temp 98 F (36.7 C) (Oral)   Resp 16   Ht 5' 3 (1.6 m)   Wt 93.1 kg   LMP 07/13/2022 (Approximate)   SpO2 96%   BMI 36.37 kg/m   Physical exam:  A&O x 3, no acute distress. Pleasant HEENT neg, no thyromegaly Lungs CTA bilat CV RRR, S1S2 normal Abdo soft, non tender, non acute Extr no edema/ tenderness Pelvic def FHT  130s Toco none   Prenatal labs:  ABO, Rh: --/--/A POS (08/25 1047) Antibody: NEG (08/25 1047) Rubella: Immune (01/29 0000) RPR: NON REACTIVE (08/25 1042)  HBsAg: Negative (01/29 0000)  Hep C neg  HIV: Non-reactive (01/29 0000)  GBS: Negative/-- (07/22 0000)  Glucola nl  AFP1 nl NIPT low risk girl   Assessment/Plan: 28 yo G4P1021 at 40.3 wks  for repeat C-section Risks/complications of surgery reviewed incl infection, bleeding, damage to internal organs including bladder, bowels, ureters, blood vessels, other risks from anesthesia, VTE and delayed complications of any surgery, complications in future surgery reviewed. Also discussed neonatal complications incl difficult delivery, laceration, vacuum assistance, TTN etc. Pt understands and agrees, all concerns addressed.      Robbi JONELLE Render 04/21/2024, 9:38 PM

## 2024-04-22 ENCOUNTER — Inpatient Hospital Stay (HOSPITAL_COMMUNITY): Admitting: Anesthesiology

## 2024-04-22 ENCOUNTER — Encounter (HOSPITAL_COMMUNITY)
Admission: RE | Disposition: A | Discharge status: Home / Self Care | Payer: Self-pay | Source: Home / Self Care | Attending: Obstetrics & Gynecology

## 2024-04-22 ENCOUNTER — Other Ambulatory Visit: Payer: Self-pay

## 2024-04-22 ENCOUNTER — Inpatient Hospital Stay (HOSPITAL_COMMUNITY)
Admission: RE | Admit: 2024-04-22 | Discharge: 2024-04-24 | DRG: 787 | Disposition: A | Discharge status: Home / Self Care | Attending: Obstetrics & Gynecology | Admitting: Obstetrics & Gynecology

## 2024-04-22 ENCOUNTER — Encounter (HOSPITAL_COMMUNITY): Payer: Self-pay | Admitting: Obstetrics & Gynecology

## 2024-04-22 DIAGNOSIS — O99019 Anemia complicating pregnancy, unspecified trimester: Secondary | ICD-10-CM | POA: Diagnosis present

## 2024-04-22 DIAGNOSIS — O9081 Anemia of the puerperium: Secondary | ICD-10-CM | POA: Diagnosis not present

## 2024-04-22 DIAGNOSIS — D509 Iron deficiency anemia, unspecified: Secondary | ICD-10-CM | POA: Diagnosis present

## 2024-04-22 DIAGNOSIS — O99214 Obesity complicating childbirth: Secondary | ICD-10-CM | POA: Diagnosis present

## 2024-04-22 DIAGNOSIS — Z8249 Family history of ischemic heart disease and other diseases of the circulatory system: Secondary | ICD-10-CM

## 2024-04-22 DIAGNOSIS — Z98891 History of uterine scar from previous surgery: Principal | ICD-10-CM

## 2024-04-22 DIAGNOSIS — D62 Acute posthemorrhagic anemia: Secondary | ICD-10-CM | POA: Diagnosis not present

## 2024-04-22 DIAGNOSIS — O34211 Maternal care for low transverse scar from previous cesarean delivery: Principal | ICD-10-CM | POA: Diagnosis present

## 2024-04-22 DIAGNOSIS — Z3A40 40 weeks gestation of pregnancy: Secondary | ICD-10-CM | POA: Diagnosis not present

## 2024-04-22 SURGERY — Surgical Case
Anesthesia: Spinal | Site: Abdomen

## 2024-04-22 MED ORDER — KETOROLAC TROMETHAMINE 30 MG/ML IJ SOLN
INTRAMUSCULAR | Status: AC
  Filled 2024-04-22: qty 1

## 2024-04-22 MED ORDER — POVIDONE-IODINE 10 % EX SWAB
2.0000 | Freq: Once | CUTANEOUS | Status: AC
  Administered 2024-04-22: 2 via TOPICAL

## 2024-04-22 MED ORDER — ONDANSETRON HCL 4 MG/2ML IJ SOLN
INTRAMUSCULAR | Status: DC | PRN
  Administered 2024-04-22: 4 mg via INTRAVENOUS

## 2024-04-22 MED ORDER — DIPHENHYDRAMINE HCL 25 MG PO CAPS: 25.0000 mg | ORAL_CAPSULE | ORAL | Status: DC | PRN

## 2024-04-22 MED ORDER — SENNOSIDES-DOCUSATE SODIUM 8.6-50 MG PO TABS
2.0000 | ORAL_TABLET | Freq: Every day | ORAL | Status: DC
  Administered 2024-04-23 – 2024-04-24 (×2): 2 via ORAL
  Filled 2024-04-22 (×3): qty 2

## 2024-04-22 MED ORDER — KETOROLAC TROMETHAMINE 30 MG/ML IJ SOLN: 30.0000 mg | Freq: Four times a day (QID) | INTRAMUSCULAR | Status: AC | PRN

## 2024-04-22 MED ORDER — PHENYLEPHRINE HCL-NACL 20-0.9 MG/250ML-% IV SOLN
INTRAVENOUS | Status: DC | PRN
Start: 2024-04-22 — End: 2024-04-22
  Administered 2024-04-22: 60 ug/min via INTRAVENOUS

## 2024-04-22 MED ORDER — FENTANYL CITRATE (PF) 100 MCG/2ML IJ SOLN
INTRAMUSCULAR | Status: DC | PRN
  Administered 2024-04-22: 15 ug via INTRATHECAL

## 2024-04-22 MED ORDER — WITCH HAZEL-GLYCERIN EX PADS: 1.0000 | MEDICATED_PAD | CUTANEOUS | Status: DC | PRN

## 2024-04-22 MED ORDER — NALOXONE HCL 4 MG/10ML IJ SOLN: 1.0000 ug/kg/h | INTRAVENOUS | Status: DC | PRN

## 2024-04-22 MED ORDER — SODIUM CHLORIDE 0.9% FLUSH: 3.0000 mL | INTRAVENOUS | Status: DC | PRN

## 2024-04-22 MED ORDER — OXYTOCIN-SODIUM CHLORIDE 30-0.9 UT/500ML-% IV SOLN
INTRAVENOUS | Status: AC
  Filled 2024-04-22: qty 500

## 2024-04-22 MED ORDER — OXYTOCIN-SODIUM CHLORIDE 30-0.9 UT/500ML-% IV SOLN
2.5000 [IU]/h | INTRAVENOUS | Status: AC
  Administered 2024-04-22: 2.5 [IU]/h via INTRAVENOUS

## 2024-04-22 MED ORDER — ACETAMINOPHEN 500 MG PO TABS
1000.0000 mg | ORAL_TABLET | Freq: Four times a day (QID) | ORAL | Status: AC
  Administered 2024-04-23 (×3): 1000 mg via ORAL
  Filled 2024-04-22 (×3): qty 2

## 2024-04-22 MED ORDER — OXYCODONE HCL 5 MG PO TABS: 5.0000 mg | ORAL_TABLET | ORAL | Status: DC | PRN

## 2024-04-22 MED ORDER — OXYCODONE HCL 5 MG PO TABS: 5.0000 mg | ORAL_TABLET | Freq: Once | ORAL | Status: DC | PRN

## 2024-04-22 MED ORDER — PHENYLEPHRINE HCL-NACL 20-0.9 MG/250ML-% IV SOLN
INTRAVENOUS | Status: AC
  Filled 2024-04-22: qty 250

## 2024-04-22 MED ORDER — COCONUT OIL OIL: 1.0000 | TOPICAL_OIL | Status: DC | PRN

## 2024-04-22 MED ORDER — ZOLPIDEM TARTRATE 5 MG PO TABS: 5.0000 mg | ORAL_TABLET | Freq: Every evening | ORAL | Status: DC | PRN

## 2024-04-22 MED ORDER — ACETAMINOPHEN 325 MG PO TABS
650.0000 mg | ORAL_TABLET | ORAL | Status: DC | PRN
  Administered 2024-04-23 – 2024-04-24 (×3): 650 mg via ORAL
  Filled 2024-04-22 (×3): qty 2

## 2024-04-22 MED ORDER — CEFAZOLIN SODIUM-DEXTROSE 2-4 GM/100ML-% IV SOLN
2.0000 g | INTRAVENOUS | Status: AC
  Administered 2024-04-22: 2 g via INTRAVENOUS

## 2024-04-22 MED ORDER — LACTATED RINGERS IV SOLN: INTRAVENOUS | Status: DC | PRN

## 2024-04-22 MED ORDER — DEXAMETHASONE SODIUM PHOSPHATE 10 MG/ML IJ SOLN
INTRAMUSCULAR | Status: DC | PRN
  Administered 2024-04-22: 4 mg via INTRAVENOUS

## 2024-04-22 MED ORDER — STERILE WATER FOR IRRIGATION IR SOLN
Status: DC | PRN
  Administered 2024-04-22: 1000 mL

## 2024-04-22 MED ORDER — MENTHOL 3 MG MT LOZG: 1.0000 | LOZENGE | OROMUCOSAL | Status: DC | PRN

## 2024-04-22 MED ORDER — DIPHENHYDRAMINE HCL 25 MG PO CAPS: 25.0000 mg | ORAL_CAPSULE | Freq: Four times a day (QID) | ORAL | Status: DC | PRN

## 2024-04-22 MED ORDER — PRENATAL MULTIVITAMIN CH
1.0000 | ORAL_TABLET | Freq: Every day | ORAL | Status: DC
  Administered 2024-04-23 – 2024-04-24 (×2): 1 via ORAL
  Filled 2024-04-22 (×2): qty 1

## 2024-04-22 MED ORDER — NALOXONE HCL 0.4 MG/ML IJ SOLN: 0.4000 mg | INTRAMUSCULAR | Status: DC | PRN

## 2024-04-22 MED ORDER — MORPHINE SULFATE (PF) 0.5 MG/ML IJ SOLN
INTRAMUSCULAR | Status: AC
  Filled 2024-04-22: qty 10

## 2024-04-22 MED ORDER — SIMETHICONE 80 MG PO CHEW: 80.0000 mg | CHEWABLE_TABLET | ORAL | Status: DC | PRN

## 2024-04-22 MED ORDER — SCOPOLAMINE 1 MG/3DAYS TD PT72
1.0000 | MEDICATED_PATCH | Freq: Once | TRANSDERMAL | Status: DC
  Administered 2024-04-22: 1 mg via TRANSDERMAL

## 2024-04-22 MED ORDER — DIBUCAINE (PERIANAL) 1 % EX OINT: 1.0000 | TOPICAL_OINTMENT | CUTANEOUS | Status: DC | PRN

## 2024-04-22 MED ORDER — PHENYLEPHRINE 80 MCG/ML (10ML) SYRINGE FOR IV PUSH (FOR BLOOD PRESSURE SUPPORT)
PREFILLED_SYRINGE | INTRAVENOUS | Status: DC | PRN
  Administered 2024-04-22 (×3): 80 ug via INTRAVENOUS
  Administered 2024-04-22: 160 ug via INTRAVENOUS
  Administered 2024-04-22 (×2): 80 ug via INTRAVENOUS
  Administered 2024-04-22: 160 ug via INTRAVENOUS

## 2024-04-22 MED ORDER — FENTANYL CITRATE (PF) 100 MCG/2ML IJ SOLN: 25.0000 ug | INTRAMUSCULAR | Status: DC | PRN

## 2024-04-22 MED ORDER — ONDANSETRON HCL 4 MG/2ML IJ SOLN: 4.0000 mg | Freq: Three times a day (TID) | INTRAMUSCULAR | Status: DC | PRN

## 2024-04-22 MED ORDER — OXYCODONE HCL 5 MG/5ML PO SOLN: 5.0000 mg | Freq: Once | ORAL | Status: DC | PRN

## 2024-04-22 MED ORDER — KETOROLAC TROMETHAMINE 30 MG/ML IJ SOLN
30.0000 mg | Freq: Four times a day (QID) | INTRAMUSCULAR | Status: AC
  Administered 2024-04-22 – 2024-04-23 (×3): 30 mg via INTRAVENOUS
  Filled 2024-04-22 (×4): qty 1

## 2024-04-22 MED ORDER — LACTATED RINGERS IV SOLN: INTRAVENOUS | Status: AC

## 2024-04-22 MED ORDER — SCOPOLAMINE 1 MG/3DAYS TD PT72
MEDICATED_PATCH | TRANSDERMAL | Status: AC
  Filled 2024-04-22: qty 1

## 2024-04-22 MED ORDER — SIMETHICONE 80 MG PO CHEW
80.0000 mg | CHEWABLE_TABLET | Freq: Three times a day (TID) | ORAL | Status: DC
  Administered 2024-04-22 – 2024-04-24 (×6): 80 mg via ORAL
  Filled 2024-04-22 (×7): qty 1

## 2024-04-22 MED ORDER — CEFAZOLIN SODIUM-DEXTROSE 2-4 GM/100ML-% IV SOLN
INTRAVENOUS | Status: AC
Start: 2024-04-22 — End: 2024-04-22
  Filled 2024-04-22: qty 100

## 2024-04-22 MED ORDER — KETOROLAC TROMETHAMINE 30 MG/ML IJ SOLN
30.0000 mg | Freq: Four times a day (QID) | INTRAMUSCULAR | Status: AC | PRN
  Administered 2024-04-22: 30 mg via INTRAVENOUS

## 2024-04-22 MED ORDER — SODIUM CHLORIDE 0.9 % IV SOLN
25.0000 mg | Freq: Once | INTRAVENOUS | Status: AC
  Administered 2024-04-22: 25 mg via INTRAVENOUS
  Filled 2024-04-22: qty 1

## 2024-04-22 MED ORDER — LACTATED RINGERS IV SOLN: INTRAVENOUS | Status: DC

## 2024-04-22 MED ORDER — MORPHINE SULFATE (PF) 0.5 MG/ML IJ SOLN
INTRAMUSCULAR | Status: DC | PRN
  Administered 2024-04-22: 150 ug via INTRATHECAL

## 2024-04-22 MED ORDER — IBUPROFEN 600 MG PO TABS
600.0000 mg | ORAL_TABLET | Freq: Four times a day (QID) | ORAL | Status: DC
  Administered 2024-04-23 – 2024-04-24 (×3): 600 mg via ORAL
  Filled 2024-04-22 (×3): qty 1

## 2024-04-22 MED ORDER — SODIUM CHLORIDE 0.9 % IR SOLN
Status: DC | PRN
  Administered 2024-04-22: 1000 mL

## 2024-04-22 MED ORDER — FENTANYL CITRATE (PF) 100 MCG/2ML IJ SOLN
INTRAMUSCULAR | Status: AC
  Filled 2024-04-22: qty 2

## 2024-04-22 MED ORDER — OXYTOCIN-SODIUM CHLORIDE 30-0.9 UT/500ML-% IV SOLN
INTRAVENOUS | Status: DC | PRN
  Administered 2024-04-22: 999 mL/h via INTRAVENOUS

## 2024-04-22 MED ORDER — BUPIVACAINE IN DEXTROSE 0.75-8.25 % IT SOLN
INTRATHECAL | Status: DC | PRN
  Administered 2024-04-22: 1.6 mL via INTRATHECAL

## 2024-04-22 MED ORDER — DIPHENHYDRAMINE HCL 50 MG/ML IJ SOLN: 12.5000 mg | INTRAMUSCULAR | Status: DC | PRN

## 2024-04-22 SURGICAL SUPPLY — 30 items
BENZOIN TINCTURE PRP APPL 2/3 (GAUZE/BANDAGES/DRESSINGS) IMPLANT
CHLORAPREP W/TINT 26 (MISCELLANEOUS) ×2 IMPLANT
CLAMP UMBILICAL CORD (MISCELLANEOUS) ×1 IMPLANT
CLOTH BEACON ORANGE TIMEOUT ST (SAFETY) ×1 IMPLANT
DRSG OPSITE POSTOP 4X10 (GAUZE/BANDAGES/DRESSINGS) ×1 IMPLANT
ELECTRODE REM PT RTRN 9FT ADLT (ELECTROSURGICAL) ×1 IMPLANT
EXTRACTOR VACUUM KIWI (MISCELLANEOUS) IMPLANT
EXTRACTOR VACUUM M CUP 4 TUBE (SUCTIONS) IMPLANT
GLOVE BIO SURGEON STRL SZ7 (GLOVE) ×1 IMPLANT
GLOVE BIOGEL PI IND STRL 7.0 (GLOVE) ×1 IMPLANT
GOWN STRL REUS W/TWL LRG LVL3 (GOWN DISPOSABLE) ×2 IMPLANT
KIT ABG SYR 3ML LUER SLIP (SYRINGE) IMPLANT
NDL HYPO 25X5/8 SAFETYGLIDE (NEEDLE) IMPLANT
NEEDLE HYPO 25X5/8 SAFETYGLIDE (NEEDLE) IMPLANT
NS IRRIG 1000ML POUR BTL (IV SOLUTION) ×1 IMPLANT
PACK C SECTION WH (CUSTOM PROCEDURE TRAY) ×1 IMPLANT
PAD OB MATERNITY 4.3X12.25 (PERSONAL CARE ITEMS) ×1 IMPLANT
RTRCTR C-SECT PINK 25CM LRG (MISCELLANEOUS) IMPLANT
STRIP CLOSURE SKIN 1/2X4 (GAUZE/BANDAGES/DRESSINGS) IMPLANT
SUT MNCRL 0 VIOLET CTX 36 (SUTURE) ×2 IMPLANT
SUT PLAIN 0 NONE (SUTURE) IMPLANT
SUT PLAIN ABS 2-0 CT1 27XMFL (SUTURE) IMPLANT
SUT VIC AB 0 CT1 27XBRD ANBCTR (SUTURE) ×2 IMPLANT
SUT VIC AB 2-0 CT1 TAPERPNT 27 (SUTURE) ×1 IMPLANT
SUT VIC AB 2-0 SH 27XBRD (SUTURE) ×2 IMPLANT
SUT VIC AB 4-0 KS 27 (SUTURE) ×1 IMPLANT
SUT VICRYL 0 TIES 12 18 (SUTURE) IMPLANT
TOWEL OR 17X24 6PK STRL BLUE (TOWEL DISPOSABLE) ×1 IMPLANT
TRAY FOLEY W/BAG SLVR 14FR LF (SET/KITS/TRAYS/PACK) IMPLANT
WATER STERILE IRR 1000ML POUR (IV SOLUTION) ×1 IMPLANT

## 2024-04-22 NOTE — Transfer of Care (Signed)
Immediate Anesthesia Transfer of Care Note  Patient: Anne Underwood  Procedure(s) Performed: CESAREAN DELIVERY (Abdomen)  Patient Location: PACU  Anesthesia Type:Spinal  Level of Consciousness: awake  Airway & Oxygen Therapy: Patient Spontanous Breathing  Post-op Assessment: Report given to RN and Post -op Vital signs reviewed and stable  Post vital signs: Reviewed and stable  Last Vitals:  Vitals Value Taken Time  BP 97/69 04/22/24 16:04  Temp 36.4 C 04/22/24 16:04  Pulse 76 04/22/24 16:07  Resp 17 04/22/24 16:07  SpO2 98 % 04/22/24 16:07  Vitals shown include unfiled device data.  Last Pain:  Vitals:   04/22/24 1604  TempSrc: Oral         Complications: No notable events documented.

## 2024-04-22 NOTE — Lactation Note (Signed)
 This note was copied from a baby's chart. Lactation Consultation Note  Patient Name: Anne Underwood Date: 04/22/2024 Age:29 hours Reason for consult: Initial assessment;Mother's request;Term;Breastfeeding assistance  P2- MOB requested latching assistance. Infant was gagging when FOB was changing her diaper. Infant needed to be bulb suctioned due to the volume of amniotic fluid that she was able to work out. LC assisted MOB with placing infant on the left breast in the cross cradle hold. LC demonstrated how to sandwich the breast and stroke MOB's nipple from infant's nose to chin to elicit a gaping mouth. Infant was able to latch with flanged lips after a few attempts. Infant had a moderate, but rhythmic suck at this time. LC praised MOB and infant. MOB denied having further questions or concerns.   LC reviewed the first 24 hr birthday nap, day 2 cluster feeding, feeding infant on cue 8-12x in 24 hrs, not allowing infant to go over 3 hrs without a  feeding, CDC milk storage guidelines, LC services handout and engorgement/breast care. LC encouraged MOB to call for further assistance as needed.  Maternal Data Has patient been taught Hand Expression?: Yes Does the patient have breastfeeding experience prior to this delivery?: Yes How long did the patient breastfeed?: 2 years with first child  Feeding Mother's Current Feeding Choice: Breast Milk  LATCH Score Latch: Repeated attempts needed to sustain latch, nipple held in mouth throughout feeding, stimulation needed to elicit sucking reflex.  Audible Swallowing: None  Type of Nipple: Everted at rest and after stimulation  Comfort (Breast/Nipple): Soft / non-tender  Hold (Positioning): Assistance needed to correctly position infant at breast and maintain latch.  LATCH Score: 6   Lactation Tools Discussed/Used Pump Education: Milk Storage  Interventions Interventions: Breast feeding basics reviewed;Assisted with  latch;Breast compression;Adjust position;Support pillows;Position options;Education;LC Services brochure  Discharge Discharge Education: Engorgement and breast care;Warning signs for feeding baby Pump: DEBP;Manual;Personal  Consult Status Consult Status: Follow-up Date: 04/23/24 Follow-up type: In-patient    Recardo Hoit BS, IBCLC 04/22/2024, 9:20 PM

## 2024-04-22 NOTE — Op Note (Signed)
 Cesarean Section Procedure Note   Anne Underwood  04/22/2024  Indications: Scheduled Proceedure/Maternal Request   Pre-operative Diagnosis: History of cesarean section.   Post-operative Diagnosis: Same   Surgeon:  Barbette Knock, MD   Assistants: Sherrilee Both CNM   Anesthesia: spinal   Procedure Details:  The patient was seen in the Holding Room. The risks, benefits, complications, treatment options, and expected outcomes were discussed with the patient. The patient concurred with the proposed plan, giving informed consent. identified as [name] and the procedure verified as C-Section Delivery. A Time Out was held and the above information confirmed. 2 gm Ancef  given.  After induction of anesthesia, the patient was draped and prepped in the usual sterile manner, foley was draining urine well.  A pfannenstiel incision was made and carried down through the subcutaneous tissue to the fascia. Fascial incision was made and extended transversely. The fascia was separated from the underlying rectus tissue superiorly and inferiorly. The peritoneum was identified and entered. Peritoneal incision was extended longitudinally. Alexis-O retractor placed. The utero-vesical peritoneal reflection was incised transversely and the bladder flap was bluntly freed from the lower uterine segment. A low transverse uterine incision was made. Very light yellow stain noted in fluid. Delivered from cephalic presentation was a female infant with vigorous cry. Apgar scores of 9 at one minute and 9 at five minutes. Delayed cord clamping done at 1 minute and baby handed to NICU team in attendance. Cord ph was not sent. Cord blood was obtained for evaluation. The placenta was removed Intact and appeared normal. The uterine outline, tubes and ovaries appeared normal. The uterine incision was closed with running locked sutures of 0 Monocryl in single layer. Hemostasis was observed. Alexis retractor removed.  Peritoneal closure done with 2-0 Vicryl.  The fascia was then reapproximated with running sutures of 0Vicryl. The subcuticular closure was performed using 2-0plain gut. The skin was closed with 4-0Vicryl.   Instrument, sponge, and needle counts were correct prior the abdominal closure and were correct at the conclusion of the case.   Findings:  female infant cephalic delivery from [facility] hysterotomy. No concerns. Apgars 9 and 9. Baby girl, 10 pounds.    Estimated Blood Loss:  250 cc  Total IV Fluids: 2000 ml  LR   Urine Output: 200 CC OF clear urine  Specimens: cord blood   Complications: no complications  Disposition: PACU - hemodynamically stable.   Maternal Condition: stable   Baby condition / location:  Couplet care / Skin to Skin  Attending Attestation: I performed the procedure.   Signed: Surgeon(s): Barbette Knock, MD

## 2024-04-22 NOTE — Anesthesia Preprocedure Evaluation (Signed)
 Anesthesia Evaluation  Patient identified by MRN, date of birth, ID band Patient awake    Reviewed: Allergy & Precautions, NPO status , Patient's Chart, lab work & pertinent test results  Airway Mallampati: II  TM Distance: >3 FB Neck ROM: Full    Dental no notable dental hx.    Pulmonary neg pulmonary ROS   Pulmonary exam normal breath sounds clear to auscultation       Cardiovascular negative cardio ROS Normal cardiovascular exam Rhythm:Regular Rate:Normal     Neuro/Psych  PSYCHIATRIC DISORDERS Anxiety     negative neurological ROS     GI/Hepatic negative GI ROS, Neg liver ROS,,,  Endo/Other  negative endocrine ROS  Obese BMI 36  Renal/GU negative Renal ROS  negative genitourinary   Musculoskeletal negative musculoskeletal ROS (+)    Abdominal   Peds  (+) ATTENTION DEFICIT DISORDER WITHOUT HYPERACTIVITY Hematology  (+) Blood dyscrasia, anemia Hgb 9.7   Anesthesia Other Findings H/o C/Sx1  Reproductive/Obstetrics (+) Pregnancy                              Anesthesia Physical Anesthesia Plan  ASA: 2  Anesthesia Plan: Spinal   Post-op Pain Management:    Induction:   PONV Risk Score and Plan: Treatment may vary due to age or medical condition  Airway Management Planned: Natural Airway  Additional Equipment:   Intra-op Plan:   Post-operative Plan:   Informed Consent: I have reviewed the patients History and Physical, chart, labs and discussed the procedure including the risks, benefits and alternatives for the proposed anesthesia with the patient or authorized representative who has indicated his/her understanding and acceptance.     Dental advisory given  Plan Discussed with: CRNA  Anesthesia Plan Comments:         Anesthesia Quick Evaluation

## 2024-04-22 NOTE — Anesthesia Procedure Notes (Signed)
 Spinal  Patient location during procedure: OR Start time: 04/22/2024 2:52 PM End time: 04/22/2024 2:55 PM Reason for block: surgical anesthesia Staffing Performed: anesthesiologist  Anesthesiologist: Niels Marien CROME, MD Performed by: Niels Marien CROME, MD Authorized by: Niels Marien CROME, MD   Preanesthetic Checklist Completed: patient identified, IV checked, risks and benefits discussed, surgical consent, monitors and equipment checked, pre-op evaluation and timeout performed Spinal Block Patient position: sitting Prep: DuraPrep and site prepped and draped Patient monitoring: cardiac monitor, continuous pulse ox and blood pressure Approach: midline Location: L3-4 Injection technique: single-shot Needle Needle type: Pencan  Needle gauge: 24 G Needle length: 9 cm Assessment Sensory level: T6 Events: CSF return Additional Notes Functioning IV was confirmed and monitors were applied. Sterile prep and drape, including hand hygiene and sterile gloves were used. The patient was positioned and the spine was prepped. The skin was anesthetized with lidocaine .  Free flow of clear CSF was obtained prior to injecting local anesthetic into the CSF.  The spinal needle aspirated freely following injection.  The needle was carefully withdrawn.  The patient tolerated the procedure well.

## 2024-04-23 DIAGNOSIS — Q826 Congenital sacral dimple: Secondary | ICD-10-CM | POA: Diagnosis not present

## 2024-04-23 LAB — CBC
HCT: 24.9 % — ABNORMAL LOW (ref 36.0–46.0)
Hemoglobin: 7.9 g/dL — ABNORMAL LOW (ref 12.0–15.0)
MCH: 24.8 pg — ABNORMAL LOW (ref 26.0–34.0)
MCHC: 31.7 g/dL (ref 30.0–36.0)
MCV: 78.1 fL — ABNORMAL LOW (ref 80.0–100.0)
Platelets: 169 K/uL (ref 150–400)
RBC: 3.19 MIL/uL — ABNORMAL LOW (ref 3.87–5.11)
RDW: 16.4 % — ABNORMAL HIGH (ref 11.5–15.5)
WBC: 11 K/uL — ABNORMAL HIGH (ref 4.0–10.5)
nRBC: 0 % (ref 0.0–0.2)

## 2024-04-23 MED ORDER — EPINEPHRINE 0.3 MG/0.3ML IJ SOAJ: 0.3000 mg | Freq: Once | INTRAMUSCULAR | Status: DC | PRN

## 2024-04-23 MED ORDER — SODIUM CHLORIDE 0.9 % IV SOLN
500.0000 mg | Freq: Once | INTRAVENOUS | Status: DC
  Filled 2024-04-23: qty 25

## 2024-04-23 MED ORDER — SODIUM CHLORIDE 0.9 % IV SOLN
500.0000 mg | Freq: Once | INTRAVENOUS | Status: AC
  Administered 2024-04-23: 500 mg via INTRAVENOUS
  Filled 2024-04-23: qty 25

## 2024-04-23 MED ORDER — SODIUM CHLORIDE 0.9 % IV SOLN: INTRAVENOUS | Status: AC | PRN

## 2024-04-23 MED ORDER — METHYLPREDNISOLONE SODIUM SUCC 125 MG IJ SOLR: 125.0000 mg | Freq: Once | INTRAMUSCULAR | Status: DC | PRN

## 2024-04-23 MED ORDER — SODIUM CHLORIDE 0.9 % IV BOLUS: 500.0000 mL | Freq: Once | INTRAVENOUS | Status: DC | PRN

## 2024-04-23 MED ORDER — ALBUTEROL SULFATE (2.5 MG/3ML) 0.083% IN NEBU: 2.5000 mg | INHALATION_SOLUTION | Freq: Once | RESPIRATORY_TRACT | Status: DC | PRN

## 2024-04-23 MED ORDER — DIPHENHYDRAMINE HCL 50 MG/ML IJ SOLN: 25.0000 mg | Freq: Once | INTRAMUSCULAR | Status: DC | PRN

## 2024-04-23 NOTE — Progress Notes (Signed)
 Chronic anemia affecting pregnancy Start IV Venofer  500mg  at 8 am

## 2024-04-23 NOTE — Progress Notes (Addendum)
 MOB was referred for history of anxiety/ADD.  * Referral screened out by Clinical Social Worker because none of the following criteria appear to apply:  ~ History of anxiety/ADD during this pregnancy, or of post-partum depression following prior delivery.  ~ Diagnosis of anxiety/ ADD within last 3 years  OR  * MOB's symptoms currently being treated with medication and/or therapy.  Per OB notes, MOB has a active prescription Sertraline 100mg  for support.  Edinburgh score 7  Please contact the Clinical Social Worker if needs arise, by [facility] request, or if MOB scores greater than 9/yes to question 10 on Edinburgh Postpartum Depression Screen.  Rosina Molt, [place] Clinical Social Worker [phone]

## 2024-04-23 NOTE — Progress Notes (Signed)
 Subjective: Postpartum Day 1: scheduled repeat Cesarean Delivery Patient reports feels well, ambulating, voided x 2 since foley out this am,  tolerating reg diet pain controlled. Not taken oxycodone  lochia decreased .    Objective: Vital signs in last 24 hours: Temp:  [97.3 F (36.3 C)-98.2 F (36.8 C)] 98.2 F (36.8 C) (08/28 0900) Pulse Rate:  [60-96] 65 (08/28 0900) Resp:  [13-24] 16 (08/28 0900) BP: (85-117)/(52-81) 85/62 (08/28 0900) SpO2:  [94 %-99 %] 99 % (08/28 0900) Weight:  [93.1 kg] 93.1 kg (08/27 1303)  Physical Exam:  General: alert, cooperative, and appears stated age Lungs CTA b/l CV RRR Abdo soft, ND NT NABS  Lochia: appropriate Uterine Fundus: firm Incision: healing well, no significant drainage DVT Evaluation: No evidence of DVT seen on physical exam. Negative Homan's sign.     Latest Ref Rng & Units 04/23/2024    4:34 AM 04/20/2024   10:42 AM 03/26/2024    4:13 AM  CBC  WBC 4.0 - 10.5 K/uL 11.0  9.7  15.3   Hemoglobin 12.0 - 15.0 g/dL 7.9  9.7  89.7   Hematocrit 36.0 - 46.0 % 24.9  31.4  31.8   Platelets 150 - 400 K/uL 169  217  210      Assessment/Plan: Status post Cesarean section. Doing well postoperatively.  PPD #1 stable cont care  LC to assist Anemia in pregnancy, worsening with acute blood loss, asymptomatic, clinically significant.  Needs rapid increase in H/H. IV Venofer  today, accepts . Girl 10 lbs,  breast Hoping for early discharge tomorrow   Robbi JONELLE Render, MD 04/23/2024, 11:13 AM

## 2024-04-23 NOTE — Anesthesia Postprocedure Evaluation (Signed)
Anesthesia Post Note  Patient: Anne Underwood  Procedure(s) Performed: CESAREAN DELIVERY (Abdomen)     Patient location during evaluation: PACU Anesthesia Type: Spinal Level of consciousness: oriented and awake and alert Pain management: pain level controlled Vital Signs Assessment: post-procedure vital signs reviewed and stable Respiratory status: spontaneous breathing, respiratory function stable and patient connected to nasal cannula oxygen Cardiovascular status: blood pressure returned to baseline and stable Postop Assessment: no headache, no backache and no apparent nausea or vomiting Anesthetic complications: no   No notable events documented.  Last Vitals:  Vitals:   04/23/24 0027 04/23/24 0436  BP: (!) 89/54 (!) 91/54  Pulse: 65 78  Resp: 16 16  Temp: 36.5 C 36.7 C  SpO2: 96% 98%    Last Pain:  Vitals:   04/23/24 0506  TempSrc:   PainSc: 0-No pain                 Jahdiel Krol L Omauri Boeve

## 2024-04-23 NOTE — Lactation Note (Signed)
 This note was copied from a baby's chart. Lactation Consultation Note  Patient Name: Anne Underwood Date: 04/23/2024 Age:29 hours Reason for consult: Follow-up assessment;Term  P2, Mother states infant's latch is improving. Recommend waiting until baby is 3 weeks to introduce a pacifier. Reviewed cluster feeding.   Suggest calling for help as needed.   Maternal Data Has patient been taught Hand Expression?: Yes  Feeding Mother's Current Feeding Choice: Breast Milk  Interventions Interventions: Education   Consult Status Consult Status: Follow-up Date: 04/24/24 Follow-up type: In-patient  Shannon Levorn Lemme  RN, IBCLC 04/23/2024, 11:58 AM

## 2024-04-24 MED ORDER — IBUPROFEN 600 MG PO TABS
600.0000 mg | ORAL_TABLET | Freq: Four times a day (QID) | ORAL | 0 refills | Status: AC
Start: 2024-04-24 — End: ?

## 2024-04-24 MED ORDER — OXYCODONE HCL 5 MG PO TABS: 5.0000 mg | ORAL_TABLET | ORAL | 0 refills | Status: AC | PRN

## 2024-04-24 MED ORDER — ACETAMINOPHEN 325 MG PO TABS: 650.0000 mg | ORAL_TABLET | ORAL | Status: AC | PRN

## 2024-04-24 MED ORDER — WITCH HAZEL-GLYCERIN EX PADS
1.0000 | MEDICATED_PAD | CUTANEOUS | 12 refills | Status: AC | PRN
Start: 2024-04-24 — End: ?

## 2024-04-24 NOTE — Discharge Summary (Signed)
 Postpartum Discharge Summary  Date of Service updated:      Patient Name: Anne Underwood DOB: 10/31/1995 MRN: [id number]  Date of admission: 04/22/2024 Delivery date:04/22/2024 Delivering provider: MODY, VAISHALI Date of discharge: 04/24/2024  Admitting diagnosis: Previous cesarean section [Z98.891] Status post repeat low transverse cesarean section [Z98.891] Intrauterine pregnancy: [redacted]w[redacted]d     Secondary diagnosis:  Principal Problem:   Previous cesarean section Active Problems:   Anemia complicating pregnancy   Iron  deficiency anemia   Status post repeat low transverse cesarean section    Discharge diagnosis: Term Pregnancy Delivered                                              Complications: None  Hospital course: Sceduled C/S   28 y.o. yo [id number] at [redacted]w[redacted]d was admitted to the hospital 04/22/2024 for scheduled cesarean section with the following indication:Prior Uterine Surgery.Delivery details are as follows:  Membrane Rupture Time/Date: 3:19 PM,04/22/2024  Delivery Method:C-Section, Low Transverse Operative Delivery:N/A Details of operation can be found in separate operative note.  Patient had a postpartum course that was uncomplicated.  She is ambulating, tolerating a regular diet, passing flatus, and urinating well. Patient is discharged home in stable condition on  04/24/24        Newborn Data: Birth date:04/22/2024 Birth time:3:19 PM Gender:Female Living status:Living Apgars:9 ,9  Weight:4550 g    Immunizations administered: Immunization History  Administered Date(s) Administered   Dtap, Unspecified 02/04/1995, 04/01/1995, 06/20/1995, 05/27/1996, 12/12/1999   HIB, Unspecified 04/01/1995   Hep A / Hep B 02/17/2013   Hep B, Unspecified 18-Jul-1995, 02/04/1995, 06/20/1995   Hpv-Unspecified 04/04/2006   Influenza, Seasonal, Injecte, Preservative Fre 07/02/2011, 05/19/2012   Influenza-Unspecified 06/29/2019, 03/31/2020   MMR 01/15/1996, 12/12/1999   Meningococcal  Conjugate 02/17/2013   Meningococcal Mcv4o 02/16/2013   PPD Test 02/08/2015, 02/12/2017   Polio, Unspecified 02/04/1995, 04/01/1995, 06/20/1995, 12/12/1999   Td 04/04/2006   Tdap 04/04/2006, 08/31/2019, 09/01/2019   Varicella 01/15/1996, 05/22/2012    Physical exam  Vitals:   04/23/24 0436 04/23/24 0900 04/23/24 2349 04/24/24 0523  BP: (!) 91/54 (!) 85/62 112/70 (!) 101/53  Pulse: 78 65 70 81  Resp: 16 16 16 16   Temp: 98.1 F (36.7 C) 98.2 F (36.8 C) 98.2 F (36.8 C) 97.7 F (36.5 C)  TempSrc: Axillary Axillary Oral Oral  SpO2: 98% 99% 97% 99%  Weight:      Height:       General: alert, cooperative, and no distress Lochia: appropriate Uterine Fundus: firm Incision: Healing well with no significant drainage, No significant erythema dressing intact with honeycomb dressing DVT Evaluation: No evidence of DVT seen on physical exam. Negative Homan's sign. No cords or calf tenderness. Labs: Lab Results  Component Value Date   WBC 11.0 (H) 04/23/2024   HGB 7.9 (L) 04/23/2024   HCT 24.9 (L) 04/23/2024   MCV 78.1 (L) 04/23/2024   PLT 169 04/23/2024      Latest Ref Rng & Units 06/11/2017    4:34 PM  CMP  Glucose 65 - 99 mg/dL 71   BUN 7 - 25 mg/dL 9   Creatinine 9.49 - 8.89 mg/dL 9.25   Sodium 864 - 853 mmol/L 138   Potassium 3.5 - 5.3 mmol/L 4.3   Chloride 98 - 110 mmol/L 105   CO2 20 - 32 mmol/L 23   Calcium  8.6 - 10.2 mg/dL 8.7    Edinburgh Score:    04/22/2024    5:16 PM  Edinburgh Postnatal Depression Scale Screening Tool  I have been able to laugh and see the funny side of things. 0  I have looked forward with enjoyment to things. 0  I have blamed myself unnecessarily when things went wrong. 1  I have been anxious or worried for no good reason. 2  I have felt scared or panicky for no good reason. 1  Things have been getting on top of me. 1  I have been so unhappy that I have had difficulty sleeping. 0  I have felt sad or miserable. 1  I have been so  unhappy that I have been crying. 1  The thought of harming myself has occurred to me. 0  Edinburgh Postnatal Depression Scale Total 7      After visit meds:  Allergies as of 04/24/2024       Reactions   Tape Rash        Medication List     TAKE these medications    acetaminophen  325 MG tablet Commonly known as: TYLENOL  Take 2 tablets (650 mg total) by mouth every 4 (four) hours as needed for mild pain (pain score 1-3) or fever (temperature > 101.5.). What changed:  medication strength how much to take when to take this reasons to take this   ibuprofen  600 MG tablet Commonly known as: ADVIL  Take 1 tablet (600 mg total) by mouth every 6 (six) hours.   oxyCODONE  5 MG immediate release tablet Commonly known as: Oxy IR/ROXICODONE  Take 1 tablet (5 mg total) by mouth every 4 (four) hours as needed for moderate pain (pain score 4-6).   prenatal multivitamin Tabs tablet Take 1 tablet by mouth in the morning.   witch hazel-glycerin  pad Commonly known as: TUCKS Apply 1 Application topically as needed for hemorrhoids.         Discharge home in stable condition Infant Feeding: Breast Infant Disposition:home with mother Discharge instruction: per After Visit Summary and Postpartum booklet. Activity: Advance as tolerated. Pelvic rest for 6 weeks.  Diet: routine diet Anticipated Birth Control: Unsure Postpartum Appointment:1 week Future Appointments:No future appointments. Follow up Visit:      04/24/2024 Delila CHRISTELLA Fischer, DO

## 2024-04-24 NOTE — Lactation Note (Signed)
 This note was copied from a baby's chart. Lactation Consultation Note  Patient Name: Anne Underwood Unijb'd Date: 04/24/2024 Age:29 hours Reason for consult: Follow-up assessment;Term  P2, Mother states she does not have concerns or questions about breastfeeding. Reviewed engorgement care and monitoring voids/stools. Suggest calling for help as needed.  Maternal Data Has patient been taught Hand Expression?: Yes  Feeding Mother's Current Feeding Choice: Breast Milk  Interventions Interventions: Education  Discharge Discharge Education: Engorgement and breast care;Warning signs for feeding baby Pump: Personal;Manual;DEBP  Consult Status Consult Status: Complete Date: 04/24/24   Shannon Levorn Lemme  RN, IBCLC 04/24/2024, 1:08 PM

## 2024-04-24 NOTE — Progress Notes (Signed)
 Post Partum Day 2 Subjective: no complaints, up ad lib, voiding, tolerating PO, and + flatus  Objective: Blood pressure (!) 101/53, pulse 81, temperature 97.7 F (36.5 C), temperature source Oral, resp. rate 16, height 5' 3 (1.6 m), weight 93.1 kg, last menstrual period 07/13/2022, SpO2 99%, unknown if currently breastfeeding.  Physical Exam:  General: alert, cooperative, and no distress Lochia: appropriate Uterine Fundus: firm Incision: no significant drainage, no significant erythema, honeycomb dressing present DVT Evaluation: No evidence of DVT seen on physical exam. Negative Homan's sign. No cords or calf tenderness.  Recent Labs    04/23/24 0434  HGB 7.9*  HCT 24.9*    Assessment/Plan: Discharge home   LOS: 2 days   Anne CHRISTELLA Fischer, DO 04/24/2024, 11:56 AM

## 2024-05-04 ENCOUNTER — Telehealth (HOSPITAL_COMMUNITY): Payer: Self-pay | Admitting: *Deleted

## 2024-05-04 NOTE — Telephone Encounter (Signed)
 05/04/2024  Name: Anne Underwood MRN: [id number] DOB: 30-Oct-1995  Reason for Call:  Transition of Care Hospital Discharge Call  Contact Status: Patient Contact Status: Complete  Language assistant needed:          Follow-Up Questions: Do You Have Any Concerns About Your Health As You Heal From Delivery?: No Do You Have Any Concerns About Your Infants Health?: No  Edinburgh Postnatal Depression Scale:  In the Past 7 Days: I have been able to laugh and see the funny side of things.: As much as I always could I have looked forward with enjoyment to things.: As much as I ever did I have blamed myself unnecessarily when things went wrong.: Not very often I have been anxious or worried for no good reason.: Yes, sometimes I have felt scared or panicky for no good reason.: No, not much Things have been getting on top of me.: No, I have been coping as well as ever I have been so unhappy that I have had difficulty sleeping.: Not at all I have felt sad or miserable.: No, not at all I have been so unhappy that I have been crying.: No, never The thought of harming myself has occurred to me.: Never [place] Postnatal Depression Scale Total: 4  PHQ2-9 Depression Scale:     Discharge Follow-up: Edinburgh score requires follow up?: No Patient was advised of the following resources:: Breastfeeding Support Group, Support Group Declined email information at this time. Post-discharge interventions: Reviewed Newborn Safe Sleep Practices  Signature Allean IVAR Carton, RN, 05/04/24, [phone]

## 2024-06-02 DIAGNOSIS — Z1331 Encounter for screening for depression: Secondary | ICD-10-CM | POA: Diagnosis not present
